# Patient Record
Sex: Female | Born: 1970 | Race: White | Hispanic: No | Marital: Married | Smoking: Former smoker
Health system: Southern US, Community
[De-identification: ages and names within clinical notes are randomized; demographics above are authoritative.]

## PROBLEM LIST (undated history)

## (undated) DIAGNOSIS — C801 Malignant (primary) neoplasm, unspecified: Secondary | ICD-10-CM

## (undated) DIAGNOSIS — T4145XA Adverse effect of unspecified anesthetic, initial encounter: Secondary | ICD-10-CM

## (undated) DIAGNOSIS — T7840XA Allergy, unspecified, initial encounter: Secondary | ICD-10-CM

## (undated) DIAGNOSIS — T8859XA Other complications of anesthesia, initial encounter: Secondary | ICD-10-CM

## (undated) HISTORY — PX: TUBAL LIGATION: SHX77

## (undated) HISTORY — PX: LEEP: SHX91

## (undated) HISTORY — DX: Allergy, unspecified, initial encounter: T78.40XA

## (undated) HISTORY — PX: OTHER SURGICAL HISTORY: SHX169

---

## 1998-12-09 DIAGNOSIS — D069 Carcinoma in situ of cervix, unspecified: Secondary | ICD-10-CM | POA: Insufficient documentation

## 1999-03-21 ENCOUNTER — Ambulatory Visit (HOSPITAL_BASED_OUTPATIENT_CLINIC_OR_DEPARTMENT_OTHER): Admission: RE | Admit: 1999-03-21 | Discharge: 1999-03-21 | Payer: Self-pay | Admitting: Orthopedic Surgery

## 2004-02-03 ENCOUNTER — Other Ambulatory Visit: Admission: RE | Admit: 2004-02-03 | Discharge: 2004-02-03 | Payer: Self-pay | Admitting: Family Medicine

## 2005-09-13 ENCOUNTER — Emergency Department: Payer: Self-pay | Admitting: Emergency Medicine

## 2006-03-26 ENCOUNTER — Encounter: Payer: Self-pay | Admitting: Family Medicine

## 2006-04-12 ENCOUNTER — Encounter: Admission: RE | Admit: 2006-04-12 | Discharge: 2006-04-12 | Payer: Self-pay | Admitting: Neurology

## 2006-05-07 ENCOUNTER — Encounter: Admission: RE | Admit: 2006-05-07 | Discharge: 2006-05-07 | Payer: Self-pay | Admitting: Neurology

## 2006-06-26 ENCOUNTER — Encounter: Payer: Self-pay | Admitting: Family Medicine

## 2006-07-06 ENCOUNTER — Emergency Department (HOSPITAL_COMMUNITY): Admission: EM | Admit: 2006-07-06 | Discharge: 2006-07-06 | Payer: Self-pay | Admitting: Family Medicine

## 2006-08-10 ENCOUNTER — Encounter: Payer: Self-pay | Admitting: Family Medicine

## 2006-08-10 ENCOUNTER — Encounter: Admission: RE | Admit: 2006-08-10 | Discharge: 2006-08-10 | Payer: Self-pay | Admitting: Orthopedic Surgery

## 2006-09-13 ENCOUNTER — Encounter: Admission: RE | Admit: 2006-09-13 | Discharge: 2006-09-13 | Payer: Self-pay | Admitting: Orthopedic Surgery

## 2006-09-13 ENCOUNTER — Encounter: Payer: Self-pay | Admitting: Family Medicine

## 2006-12-17 ENCOUNTER — Other Ambulatory Visit: Admission: RE | Admit: 2006-12-17 | Discharge: 2006-12-17 | Payer: Self-pay | Admitting: Obstetrics and Gynecology

## 2007-03-10 ENCOUNTER — Emergency Department (HOSPITAL_COMMUNITY): Admission: EM | Admit: 2007-03-10 | Discharge: 2007-03-10 | Payer: Self-pay | Admitting: Family Medicine

## 2008-01-08 LAB — HM MAMMOGRAPHY

## 2008-01-14 ENCOUNTER — Ambulatory Visit: Payer: Self-pay | Admitting: Certified Nurse Midwife

## 2008-12-08 LAB — CONVERTED CEMR LAB: Pap Smear: NORMAL

## 2008-12-08 LAB — HM PAP SMEAR

## 2009-01-06 ENCOUNTER — Ambulatory Visit: Payer: Self-pay | Admitting: Obstetrics and Gynecology

## 2009-01-13 ENCOUNTER — Ambulatory Visit: Payer: Self-pay | Admitting: Obstetrics and Gynecology

## 2009-01-17 ENCOUNTER — Emergency Department: Payer: Self-pay | Admitting: Emergency Medicine

## 2009-02-02 ENCOUNTER — Encounter: Payer: Self-pay | Admitting: Family Medicine

## 2009-02-10 ENCOUNTER — Ambulatory Visit: Payer: Self-pay | Admitting: Family Medicine

## 2009-02-10 DIAGNOSIS — M2550 Pain in unspecified joint: Secondary | ICD-10-CM

## 2009-02-10 DIAGNOSIS — J309 Allergic rhinitis, unspecified: Secondary | ICD-10-CM | POA: Insufficient documentation

## 2009-02-10 DIAGNOSIS — N809 Endometriosis, unspecified: Secondary | ICD-10-CM | POA: Insufficient documentation

## 2009-02-20 ENCOUNTER — Ambulatory Visit: Payer: Self-pay | Admitting: Family Medicine

## 2009-02-20 DIAGNOSIS — L089 Local infection of the skin and subcutaneous tissue, unspecified: Secondary | ICD-10-CM | POA: Insufficient documentation

## 2009-02-21 ENCOUNTER — Encounter: Payer: Self-pay | Admitting: Family Medicine

## 2009-03-08 ENCOUNTER — Telehealth: Payer: Self-pay | Admitting: Family Medicine

## 2009-03-08 ENCOUNTER — Ambulatory Visit: Payer: Self-pay | Admitting: Family Medicine

## 2009-03-08 LAB — CONVERTED CEMR LAB: Blood Glucose, Fasting: 88 mg/dL

## 2009-03-10 LAB — CONVERTED CEMR LAB
Albumin: 4.4 g/dL (ref 3.5–5.2)
Alkaline Phosphatase: 55 units/L (ref 39–117)
BUN: 13 mg/dL (ref 6–23)
Bilirubin, Direct: 0.1 mg/dL (ref 0.0–0.3)
CO2: 27 meq/L (ref 19–32)
Calcium: 9.3 mg/dL (ref 8.4–10.5)
Cholesterol: 176 mg/dL (ref 0–200)
Creatinine, Ser: 0.7 mg/dL (ref 0.4–1.2)
LDL Cholesterol: 100 mg/dL — ABNORMAL HIGH (ref 0–99)
Total CHOL/HDL Ratio: 3
Total Protein: 7.1 g/dL (ref 6.0–8.3)
Triglycerides: 50 mg/dL (ref 0.0–149.0)

## 2009-03-15 ENCOUNTER — Ambulatory Visit: Payer: Self-pay | Admitting: Family Medicine

## 2009-03-17 ENCOUNTER — Telehealth: Payer: Self-pay | Admitting: Family Medicine

## 2009-06-01 ENCOUNTER — Ambulatory Visit: Payer: Self-pay | Admitting: Family Medicine

## 2009-06-14 ENCOUNTER — Telehealth: Payer: Self-pay | Admitting: Family Medicine

## 2009-06-15 ENCOUNTER — Telehealth: Payer: Self-pay | Admitting: Family Medicine

## 2009-07-19 ENCOUNTER — Telehealth: Payer: Self-pay | Admitting: Family Medicine

## 2009-11-17 ENCOUNTER — Telehealth: Payer: Self-pay | Admitting: Family Medicine

## 2010-01-09 ENCOUNTER — Ambulatory Visit: Payer: Self-pay | Admitting: Family Medicine

## 2010-01-09 DIAGNOSIS — F341 Dysthymic disorder: Secondary | ICD-10-CM | POA: Insufficient documentation

## 2010-02-06 ENCOUNTER — Telehealth: Payer: Self-pay | Admitting: Family Medicine

## 2010-03-07 ENCOUNTER — Ambulatory Visit: Payer: Self-pay | Admitting: Family Medicine

## 2010-03-13 DIAGNOSIS — R740 Nonspecific elevation of levels of transaminase and lactic acid dehydrogenase [LDH]: Secondary | ICD-10-CM

## 2010-03-13 LAB — CONVERTED CEMR LAB
ALT: 57 units/L — ABNORMAL HIGH (ref 0–35)
AST: 29 units/L (ref 0–37)
Bilirubin, Direct: 0.1 mg/dL (ref 0.0–0.3)
Chloride: 106 meq/L (ref 96–112)
Cholesterol: 191 mg/dL (ref 0–200)
GFR calc non Af Amer: 105.84 mL/min (ref >60)
Glucose, Bld: 84 mg/dL (ref 70–99)
HDL: 78.3 mg/dL (ref >39.00)
LDL Cholesterol: 103 mg/dL — ABNORMAL HIGH (ref 0–99)
Total Bilirubin: 0.6 mg/dL (ref 0.3–1.2)
Total CHOL/HDL Ratio: 2
Total Protein: 7.5 g/dL (ref 6.0–8.3)
Triglycerides: 49 mg/dL (ref 0.0–149.0)
VLDL: 9.8 mg/dL (ref 0.0–40.0)

## 2010-03-28 ENCOUNTER — Ambulatory Visit: Payer: Self-pay | Admitting: Family Medicine

## 2010-03-28 ENCOUNTER — Telehealth: Payer: Self-pay | Admitting: Family Medicine

## 2010-03-28 LAB — CONVERTED CEMR LAB: Albumin: 4.3 g/dL (ref 3.5–5.2)

## 2010-03-29 ENCOUNTER — Telehealth: Payer: Self-pay | Admitting: Family Medicine

## 2010-03-30 ENCOUNTER — Telehealth: Payer: Self-pay | Admitting: Family Medicine

## 2010-03-30 ENCOUNTER — Ambulatory Visit: Payer: Self-pay | Admitting: Family Medicine

## 2010-03-30 ENCOUNTER — Encounter: Payer: Self-pay | Admitting: Family Medicine

## 2010-03-30 LAB — CONVERTED CEMR LAB
HCV Ab: NEGATIVE
Hep A IgM: NEGATIVE
Hep B C IgM: NEGATIVE
Hepatitis B Surface Ag: NEGATIVE

## 2010-04-04 ENCOUNTER — Ambulatory Visit: Payer: Self-pay | Admitting: Family Medicine

## 2010-04-06 LAB — CONVERTED CEMR LAB
ALT: 94 units/L — ABNORMAL HIGH (ref 0–35)
AST: 39 units/L — ABNORMAL HIGH (ref 0–37)
Alkaline Phosphatase: 117 units/L (ref 39–117)
Bilirubin, Direct: 0.2 mg/dL (ref 0.0–0.3)
Total Bilirubin: 1.2 mg/dL (ref 0.3–1.2)
Total Protein: 7.1 g/dL (ref 6.0–8.3)

## 2010-04-19 ENCOUNTER — Ambulatory Visit: Payer: Self-pay | Admitting: Family Medicine

## 2010-04-23 LAB — CONVERTED CEMR LAB
ALT: 73 units/L — ABNORMAL HIGH (ref 0–35)
Albumin: 4.4 g/dL (ref 3.5–5.2)
Bilirubin, Direct: 0.1 mg/dL (ref 0.0–0.3)
Total Protein: 6.9 g/dL (ref 6.0–8.3)

## 2010-05-30 ENCOUNTER — Ambulatory Visit: Payer: Self-pay | Admitting: Family Medicine

## 2010-05-31 LAB — CONVERTED CEMR LAB
AST: 23 units/L (ref 0–37)
Albumin: 4.3 g/dL (ref 3.5–5.2)
Alkaline Phosphatase: 68 units/L (ref 39–117)

## 2010-07-02 ENCOUNTER — Ambulatory Visit: Payer: Self-pay | Admitting: Family Medicine

## 2010-07-02 DIAGNOSIS — N898 Other specified noninflammatory disorders of vagina: Secondary | ICD-10-CM | POA: Insufficient documentation

## 2010-07-02 LAB — CONVERTED CEMR LAB: Whiff Test: POSITIVE

## 2010-08-24 ENCOUNTER — Encounter: Payer: Self-pay | Admitting: Family Medicine

## 2010-09-30 ENCOUNTER — Encounter: Payer: Self-pay | Admitting: Obstetrics and Gynecology

## 2010-10-09 NOTE — Assessment & Plan Note (Signed)
Summary: ? VAGINAL INFECTION   Vital Signs:  Patient profile:   40 year old female Height:      61 inches Weight:      125 pounds BMI:     23.70 Temp:     98.4 degrees F oral Pulse rate:   76 / minute Pulse rhythm:   regular BP sitting:   110 / 76  (right arm) Cuff size:   regular  Vitals Entered By: Linde Gillis CMA Duncan Dull) (July 02, 2010 12:18 PM) CC: ? vaginal infection   History of Present Illness: 40 yo with ? vaginal infection  Last week had some vaginal itching and burning, no discharge. Used topical monistat, helped a little but symptoms returned. yesterday, had some thick discharge, no odor. Feels like her lower abdomen is tight. No dysuria, fever or chills.    Current Medications (verified): 1)  Probiotics .Marland KitchenMarland Kitchen. 1 Daily By Mouth 2)  Vitamin Code For Women .... 2 Capsules Two Times A Day By Mouth 3)  Metronidazole 0.75 % Gel (Metronidazole) .... 5 Gm Daily X 5 Days. Dispense Qs 4)  Diflucan 150 Mg Tabs (Fluconazole) .... Once Daily  Allergies: 1)  ! * Aspirin 2)  ! * Erythomycin 3)  ! * Augmentin 4)  ! * Versaid 5)  ! * Stadol 6)  ! * Codeine 7)  ! * Latex 8)  ! * Thinerasol  Review of Systems      See HPI General:  Denies fever. GI:  Denies nausea and vomiting. GU:  Complains of discharge; denies dysuria.  Physical Exam  General:  Well-developed,well-nourished,in no acute distress; alert,appropriate and cooperative throughout examination  Genitalia:  thick vaginal discharge, no cervical motion tenderness. normal introitus, no vaginal or cervical lesions, no friaility or hemorrhage, and no adnexal masses or tenderness.   Psych:    Very dramatic   Impression & Recommendations:  Problem # 1:  VAGINAL DISCHARGE (ICD-623.5) Assessment New Wet prep consistent with yeast and BV.  Pt refuses oral flagyl, makes her nauseated. Treat with by mouth diflucan x 1 and topical flagyl.  Send for GC/chlamydia (no CMT or signs of PID). Her updated medication  list for this problem includes:    Metronidazole 0.75 % Gel (Metronidazole) .Marland KitchenMarland KitchenMarland KitchenMarland Kitchen 5 gm daily x 5 days. dispense qs  Orders: Wet Prep (27253GU) T-GC Probe, genital ([id number]) T-Chlamydia Probe, genital ([id number])  Complete Medication List: 1)  Probiotics  .Marland Kitchen.. 1 daily by mouth 2)  Vitamin Code For Women  .... 2 capsules two times a day by mouth 3)  Metronidazole 0.75 % Gel (Metronidazole) .... 5 gm daily x 5 days. dispense qs 4)  Diflucan 150 Mg Tabs (Fluconazole) .... Once daily Prescriptions: DIFLUCAN 150 MG TABS (FLUCONAZOLE) once daily  #1 x 0   Entered and Authorized by:   Ruthe Mannan MD   Signed by:   Ruthe Mannan MD on 07/02/2010   Method used:   Electronically to        [facility] (retail)       [address]       [place], [place]  [address]       Ph: [phone]       Fax: [phone]   RxID:   [id number] METRONIDAZOLE 0.75 % GEL (METRONIDAZOLE) 5 gm daily x 5 days. dispense qs  #1 x 0   Entered and Authorized by:   Ruthe Mannan MD   Signed by:   Jovita Gamma  Dayton Martes MD on 07/02/2010   Method used:   Electronically to        [facility] (retail)       [address]       [place], [place]  [id number]       Ph: [phone]       Fax: [phone]   RxID:   [id number]    Orders Added: 1)  Wet Prep [[id number]] 2)  T-GC Probe, genital [phone] 3)  T-Chlamydia Probe, genital [[id number]] 4)  Est. Patient Level IV [95188]    Current Allergies (reviewed today): ! * ASPIRIN ! * ERYTHOMYCIN ! * AUGMENTIN ! * VERSAID ! * STADOL ! * CODEINE ! * LATEX ! [facility] Laboratory Results    Wet Mount/KOH Source: vaginal WBC/hpf 1-5 Bacteria/hpf 1+ Clue cells/hpf few  Positive whiff Yeast/hpf few Trichomonas/hpf none

## 2010-10-09 NOTE — Progress Notes (Signed)
Summary: please review lab results  Phone Note Call from Patient Call back at Home Phone [phone]   Caller: Patient Summary of Call: Pt called for hep test results.  She was told she could go for an [place] today if tests were negative.  She is very anxious and wants something done.  She would prefer to go to  for ultrasound, if ultrasound is necessary. Initial call taken by: Lowella Petties CMA,  March 29, 2010 2:04 PM  Follow-up for Phone Call        not fully back yet.  Follow-up by: Hannah Beat MD,  March 29, 2010 2:11 PM  Additional Follow-up for Phone Call Additional follow up Details #1::        just let know not all back yet - likely not all back by tonight, and Dr. Ermalene Searing will look at them in the AM Additional Follow-up by: Hannah Beat MD,  March 29, 2010 2:27 PM    Additional Follow-up for Phone Call Additional follow up Details #2::    Per Dr. Dayton Martes, advised pt that 2 tests are negative, still waiting on 2.  She is asking if ok to have relations with her husband, per Dr Dayton Martes advised that she should use condoms until all tests are back.            Lowella Petties CMA  March 29, 2010 2:30 PM

## 2010-10-09 NOTE — Progress Notes (Signed)
Summary: called report on abd ultrasound  Phone Note From Other Clinic   Caller: [facility] Summary of Call: Called report on abd ultrasound- negative, liver is normal. Initial call taken by: Lowella Petties CMA,  March 30, 2010 11:33 AM  Follow-up for Phone Call        Notify pt that Korea is nml...no cirrhoisis seen, no gallstones, no gallbladder disease. Recommend staying of amitryptiline and rechec hepatic panel next week.  Follow-up by: Kerby Nora MD,  March 30, 2010 11:36 AM     Appended Document: called report on abd ultrasound LMOM advising of [place] results. Advised to stay off of the amitriptyline and repeat blood work next week.

## 2010-10-09 NOTE — Progress Notes (Signed)
Summary: Medication update  Phone Note Call from Patient Call back at [phone]   Caller: Patient Call For: Kerby Nora MD Summary of Call: Patient says she was asked to phone in with an update on her medication (Amitriptyline).  She says it is working some but she is still not sleeping as she should.  She is waking up in the middle of the night.  Feels that the dose may need to be increased.  [facility]. Initial call taken by: Delilah Shan CMA (AAMA),  Feb 06, 2010 11:00 AM    New/Updated Medications: AMITRIPTYLINE HCL 50 MG TABS (AMITRIPTYLINE HCL) Take 1 tablet by mouth once a day Prescriptions: AMITRIPTYLINE HCL 50 MG TABS (AMITRIPTYLINE HCL) Take 1 tablet by mouth once a day  #30 x 11   Entered and Authorized by:   Kerby Nora MD   Signed by:   Kerby Nora MD on 02/06/2010   Method used:   Electronically to        [facility] (retail)       [address]       [place], [place]  [address]       Ph: [phone]       Fax: [phone]   RxID:   [id number] AMITRIPTYLINE HCL 50 MG TABS (AMITRIPTYLINE HCL) Take 1 tablet by mouth once a day  #30 x 11   Entered and Authorized by:   Kerby Nora MD   Signed by:   Kerby Nora MD on 02/06/2010   Method used:   Electronically to        [facility] (retail)       [address]       [place], [place]  [address]       Ph: [phone]       Fax: [phone]   RxID:   [id number]

## 2010-10-09 NOTE — Progress Notes (Signed)
Summary: wants tamiflu  Phone Note Call from Patient Call back at Home Phone [phone]   Caller: Patient Call For: Kerby Nora MD Summary of Call: Patient says that she woke up this morning with a fever of 102, body aches, chills. Yesterday she called about her daughter having the flu and her daughter's entire gymnastic team having the flu. She wants to know if she can get tamiflu called in to [facility].  Initial call taken by: Melody Comas,  November 17, 2009 8:46 AM  Follow-up for Phone Call        done Follow-up by: Hannah Beat MD,  November 17, 2009 9:02 AM  Additional Follow-up for Phone Call Additional follow up Details #1::        Patient notified. Additional Follow-up by: Melody Comas,  November 17, 2009 9:08 AM    New/Updated Medications: TAMIFLU 75 MG CAPS (OSELTAMIVIR PHOSPHATE) Take one capsule by mouth twice a day Prescriptions: TAMIFLU 75 MG CAPS (OSELTAMIVIR PHOSPHATE) Take one capsule by mouth twice a day  #10 x 0   Entered and Authorized by:   Hannah Beat MD   Signed by:   Hannah Beat MD on 11/17/2009   Method used:   Electronically to        [facility] (retail)       [address]       [place], [place]  [id number]       Ph: [phone]       Fax: [phone]   RxID:   [id number]

## 2010-10-09 NOTE — Progress Notes (Signed)
Summary: wants to talk to you  Phone Note Call from Patient Call back at [phone]   Caller: Patient Call For: Kerby Nora MD Summary of Call: Patient calling very upset and worried about test results could you please call her as soon as possible to reassure her about labs.Consuello Masse CMA   Initial call taken by: Benny Lennert CMA Duncan Dull),  March 28, 2010 4:58 PM  Follow-up for Phone Call        Called pt to discuss test results..no answer.Left detailed message explainging results. Will await hep panel..if abd painN/V fever go to ER. Stop amitryptiline.   If hep panel neg...will proceed with RUQ abd [place] tommorow.  Follow-up by: Kerby Nora MD,  March 28, 2010 5:09 PM

## 2010-10-09 NOTE — Assessment & Plan Note (Signed)
Summary: CPX with pap   Vital Signs:  Patient profile:   40 year old female Height:      61 inches Weight:      118.8 pounds BMI:     22.53 Temp:     98.6 degrees F oral Pulse rate:   76 / minute Pulse rhythm:   regular BP sitting:   98 / 60  (left arm) Cuff size:   regular  Vitals Entered By: Benny Lennert CMA Duncan Dull) (Jan 09, 2010 1:56 PM)  History of Present Illness: Chief complaint cpx with pap  The patient is here for annual wellness exam and preventative care.    Joint pain, multiple joints: [facility]never [facility] due to [facility] constraints. Celebrex not covered by insurance.  HAs continued pain in joints, but dealing with.  Exercsiing regularly, Yoga.     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 1 Daily By Mouth 2)  Vitamin Code For Women .... 2 Capsules Two Times A Day By Mouth  Allergies: 1)  ! * Aspirin 2)  ! * Erythomycin 3)  ! * Augmentin 4)  ! * Versaid 5)  ! * Stadol 6)  ! * Codeine 7)  ! * Latex 8)  ! * Thinerasol  Past History:  Past medical, surgical, family and social histories (including risk factors) reviewed, and no changes noted (except as noted below).  Past Medical History: Reviewed history from 02/10/2009 and no changes required. Allergic rhinitis  Past Surgical History: Reviewed history from 02/10/2009 and no changes required. LEEP  1990s groin cyst removal 09/2001   misscarriage  2003 emergancy C-section due to twins premature 2010  IUD removal BTL, right ovary  Family History: Reviewed history from 02/10/2009 and no changes required. father: high cholesterol,  HIV mother: HTN, bipolar do sister: IBS MGM: CAD, MI age 48, breast cancer MGF: HTN, pancreatic cancer, ? why on coumadin PGM: DM  Social History: Reviewed history from 02/10/2009 and no changes required. 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 CV:  Denies chest pain or discomfort. Resp:  Denies shortness of breath, sputum productive, and wheezing. GI:  Denies bloody stools, constipation, and diarrhea; some abdominal cramping...dx with endometriosis but refuses OCPs. GU:  Denies abnormal vaginal bleeding and dysuria. Derm:  Denies rash. Psych:  Complains of depression; denies anxiety, easily angered, mental problems, panic attacks, sense of great danger, and suicidal thoughts/plans; Increase oin stress with financial issues. Poor sleep at night..so due to joint pain. Zoloft , Paxil, wellbutrin minimal help.Marland Kitchenopen to trying medicaiton.  Denies manic symptoms.  Worried about weight gain and sexual function SE. Marland Kitchen  Physical Exam  General:  Well-developed,well-nourished,in no acute distress; alert,appropriate and cooperative throughout examination  Ears:  External ear exam shows no significant lesions or deformities.  Otoscopic examination reveals clear canals, tympanic membranes are intact bilaterally without bulging, retraction, inflammation or discharge. Hearing is grossly normal bilaterally. Nose:  External nasal examination shows no deformity or inflammation. Nasal mucosa  are pink and moist without lesions or exudates. Mouth:  MMM Neck:  no carotid bruit or thyromegaly no cervical or supraclavicular lymphadenopathy  Chest Wall:  No deformities, masses, or tenderness noted. Breasts:  No mass, nodules, thickening, tenderness, bulging, retraction, inflamation, nipple discharge or skin  changes noted.   Lungs:  Normal respiratory effort, chest expands symmetrically. Lungs are clear to auscultation, no crackles or wheezes. Heart:  Normal rate and regular rhythm. S1 and S2 normal without gallop, murmur, click, rub or other extra sounds. Abdomen:  Bowel sounds positive,abdomen soft and non-tender without masses, organomegaly or hernias noted. Msk:  TTP in Right wrist, right elbow, left shoulder, hips, taibone, right knee and left ankle slightly decreased ROM thoughout these joints due to pain...no erytthema, swelling, synovitis sen in any joints Pulses:  R and L posterior tibial pulses are full and equal bilaterally  Extremities:  No clubbing, cyanosis, edema, or deformity  Skin:  Intact without suspicious lesions or rashes Psych:  Oriented X3, memory intact for recent and remote, good eye contact, not anxious appearing, and not depressed appearing.   Very dramatic..? histrionic personbality disorder.    Impression & Recommendations:  Problem # 1:  Preventive Health Care (ICD-V70.0) The patient's preventative maintenance and recommended screening tests for an annual wellness exam were reviewed in full today. Brought up to date unless services declined.  Counselled on the importance of diet, exercise, and its role in overall health and mortality. The patient's FH and SH was reviewed, including their home life, tobacco status, and drug and alcohol status.     Problem # 2:  Gynecological examination-routine (ICD-V72.31) PAP q 2 years.Marland KitchenDVE done today.   Problem # 3:  DEPRESSION/ANXIETY (ICD-300.4) As well as chronic body pain...neg Rheum work up. Cannot afford Celebrex or referral to [facility].  Will start amitryptiline low dose at betime to hopefully help with mood, insomnia as well as body pain. Discussed SE.   Complete Medication List: 1)  Probiotics  .Marland Kitchen.. 1 daily by mouth 2)  Vitamin Code For Women  .... 2 capsules two times a day by mouth  Patient  Instructions: 1)  Amitryptiline 25 mg daily at bedtime.  2)  Call for follow up in 3-4 weeks.  3)  In June come back for Fasting lipids, CMET Dx v77.91  Current Allergies (reviewed today): ! * ASPIRIN ! * ERYTHOMYCIN ! * AUGMENTIN ! * VERSAID ! * STADOL ! * CODEINE ! * LATEX ! * THINERASOL  Flu Vaccine Next Due:  Refused TD Next Due:  Refused Last PAP:  normal (12/08/2008 10:33:04 AM) PAP Next Due:  2 yr Has had yearly pap smears  yearly. Last abnormal 2000..nml since  LEEP    Past Medical History:    Reviewed history from 02/10/2009 and no changes required:       Allergic rhinitis  Past Surgical History:    Reviewed history from 02/10/2009 and no changes required:       LEEP        1990s groin cyst removal       09/2001   misscarriage        2003 emergancy C-section due to twins premature       2010  IUD removal BTL, right ovary  Appended Document: CPX with pap DVE.Marland Kitchenno massses, vaginal introitis pink  Appended Document: Orders Update    Clinical Lists Changes  Medications: Added new medication of AMITRIPTYLINE HCL 25 MG TABS (AMITRIPTYLINE HCL) 1 tab by mouth at bedtime -  Signed Rx of AMITRIPTYLINE HCL 25 MG TABS (AMITRIPTYLINE HCL) 1 tab by mouth at bedtime;  #30 x 11;  Signed;  Entered by: Kerby Nora MD;  Authorized by: Kerby Nora MD;  Method used: Electronically to [facility] [address], [place], [place]  [address], Ph: [phone], Fax: [phone]    Prescriptions: AMITRIPTYLINE HCL 25 MG TABS (AMITRIPTYLINE HCL) 1 tab by mouth at bedtime  #30 x 11   Entered and Authorized by:   Kerby Nora MD   Signed by:   Kerby Nora MD on 01/09/2010   Method used:   Electronically to        [facility] (retail)       [address]       [place], [place]  [address]       Ph: [phone]       Fax: [phone]   RxID:   [id number]

## 2010-10-11 NOTE — Letter (Signed)
Summary: [facility]  [facility]   Imported By: Lester Lyncourt 09/11/2010 10:49:03  _____________________________________________________________________  External Attachment:    Type:   Image     Comment:   External Document

## 2011-01-24 ENCOUNTER — Telehealth (INDEPENDENT_AMBULATORY_CARE_PROVIDER_SITE_OTHER): Payer: Managed Care, Other (non HMO) | Admitting: Family Medicine

## 2011-01-24 DIAGNOSIS — Z13220 Encounter for screening for lipoid disorders: Secondary | ICD-10-CM

## 2011-01-24 NOTE — Telephone Encounter (Signed)
Message copied by Kerby Nora on Thu Jan 24, 2011 10:58 PM ------      Message from: Liane Comber      Created: Thu Jan 24, 2011 11:50 AM      Regarding: Cpx labs wed       Please order  future cpx labs for pt's upcomming lab appt.      Thanks      Rodney Booze

## 2011-01-30 ENCOUNTER — Other Ambulatory Visit (INDEPENDENT_AMBULATORY_CARE_PROVIDER_SITE_OTHER): Payer: Managed Care, Other (non HMO) | Admitting: Family Medicine

## 2011-01-30 DIAGNOSIS — Z13220 Encounter for screening for lipoid disorders: Secondary | ICD-10-CM

## 2011-01-30 LAB — COMPREHENSIVE METABOLIC PANEL
ALT: 39 U/L — ABNORMAL HIGH (ref 0–35)
BUN: 14 mg/dL (ref 6–23)
CO2: 30 mEq/L (ref 19–32)
Calcium: 9.3 mg/dL (ref 8.4–10.5)
Chloride: 104 mEq/L (ref 96–112)
Creatinine, Ser: 0.6 mg/dL (ref 0.4–1.2)
GFR: 115.38 mL/min (ref >60.00)
Potassium: 4 mEq/L (ref 3.5–5.1)
Total Bilirubin: 0.8 mg/dL (ref 0.3–1.2)
Total Protein: 6.9 g/dL (ref 6.0–8.3)

## 2011-01-30 LAB — LIPID PANEL: Cholesterol: 179 mg/dL (ref 0–200)

## 2011-01-31 ENCOUNTER — Encounter: Payer: Self-pay | Admitting: Family Medicine

## 2011-02-05 ENCOUNTER — Encounter: Payer: Self-pay | Admitting: Family Medicine

## 2011-02-05 ENCOUNTER — Other Ambulatory Visit (HOSPITAL_COMMUNITY)
Admission: RE | Admit: 2011-02-05 | Discharge: 2011-02-05 | Disposition: A | Discharge status: Home / Self Care | Payer: Managed Care, Other (non HMO) | Source: Ambulatory Visit | Attending: Family Medicine | Admitting: Family Medicine

## 2011-02-05 ENCOUNTER — Ambulatory Visit (INDEPENDENT_AMBULATORY_CARE_PROVIDER_SITE_OTHER): Payer: Managed Care, Other (non HMO) | Admitting: Family Medicine

## 2011-02-05 DIAGNOSIS — H538 Other visual disturbances: Secondary | ICD-10-CM

## 2011-02-05 DIAGNOSIS — Z01419 Encounter for gynecological examination (general) (routine) without abnormal findings: Secondary | ICD-10-CM

## 2011-02-05 DIAGNOSIS — G43829 Menstrual migraine, not intractable, without status migrainosus: Secondary | ICD-10-CM

## 2011-02-05 DIAGNOSIS — Z1231 Encounter for screening mammogram for malignant neoplasm of breast: Secondary | ICD-10-CM

## 2011-02-05 DIAGNOSIS — Z0000 Encounter for general adult medical examination without abnormal findings: Secondary | ICD-10-CM

## 2011-02-05 DIAGNOSIS — H5710 Ocular pain, unspecified eye: Secondary | ICD-10-CM

## 2011-02-05 DIAGNOSIS — T148XXA Other injury of unspecified body region, initial encounter: Secondary | ICD-10-CM

## 2011-02-05 LAB — CBC WITH DIFFERENTIAL/PLATELET
Basophils Absolute: 0 10*3/uL (ref 0.0–0.1)
Eosinophils Absolute: 0.1 10*3/uL (ref 0.0–0.7)
Hemoglobin: 13.7 g/dL (ref 12.0–15.0)
Lymphs Abs: 1.9 10*3/uL (ref 0.7–4.0)
MCHC: 34.7 g/dL (ref 30.0–36.0)
MCV: 94.6 fl (ref 78.0–100.0)
Monocytes Absolute: 0.4 10*3/uL (ref 0.1–1.0)
Neutro Abs: 2.4 10*3/uL (ref 1.4–7.7)
Platelets: 213 10*3/uL (ref 150.0–400.0)
RDW: 12.7 % (ref 11.5–14.6)

## 2011-02-05 LAB — TSH: TSH: 1 u[IU]/mL (ref 0.35–5.50)

## 2011-02-05 MED ORDER — NORETHINDRONE ACET-ETHINYL EST 1.5-30 MG-MCG PO TABS: 1.0000 | ORAL_TABLET | Freq: Every day | ORAL | Status: DC

## 2011-02-05 MED ORDER — SUMATRIPTAN SUCCINATE 100 MG PO TABS: ORAL_TABLET | ORAL | Status: DC

## 2011-02-05 NOTE — Progress Notes (Signed)
Subjective:    Patient ID: Caitlin Gallagher, female    DOB: February 08, 1971, 40 y.o.   MRN: [id number]  HPI  The patient is here for annual wellness exam and preventative care.    She has the following acute and chronic issues:  1.  4-5 day migraine around time of ovulation. Ongoing x  12 months. Has history of migraines when she was younger.  Wakes her up in middle of the night, pain on top of head, no nausea, very sensitive  to light and sound.  Has headache currently.  Has started having a light menses cycle lasting 3-4 days some time 11-12 days  s/p ablation. Very irregular.  Interested in  OCP to treat symptoms , understands risks and benefits.  In past imitrex has helped significantly.  Has tried 800 mg motrin and tylenol .Marland Kitchen Helps minimally.  2. Eye pain in AMs occuring daily. For past 2-3 months.  Has seen eye MD [facility]), nml eye exam.  Sharp pain in corner of left eye and following blurriness in eyes lasts 1-12 hours.  Given pataday for allergies... No help. Also concerns about MS.Marland Kitchen occ feels like muscles buzzing. No numbness, but feels like she is weaker with push ups. Has seen someone at [facility] in past.  3. Slowed healing. Bruises lasting a long time. Not bruising more easily. Has more cavities than she has ever before.  Grandmother with leukemia. She is concerned about why she is "falling apart at age 42"  Eating fruit and veggies. Getting 30 min of strengthening daily, no CV currently.   Review of Systems     Objective:   Physical Exam  Constitutional: Vital signs are normal. She appears well-developed and well-nourished. She is cooperative.  Non-toxic appearance. She does not appear ill. No distress.  HENT:  Head: Normocephalic.  Right Ear: Hearing, tympanic membrane, external ear and ear canal normal.  Left Ear: Hearing, tympanic membrane, external ear and ear canal normal.  Nose: Nose normal.  Eyes: Conjunctivae, EOM and lids are normal.  Pupils are equal, round, and reactive to light. No foreign bodies found.  Neck: Trachea normal and normal range of motion. Neck supple. Carotid bruit is not present. No mass and no thyromegaly present.  Cardiovascular: Normal rate, regular rhythm, S1 normal, S2 normal, normal heart sounds and intact distal pulses.  Exam reveals no gallop.   No murmur heard. Pulmonary/Chest: Effort normal and breath sounds normal. No respiratory distress. She has no wheezes. She has no rhonchi. She has no rales.  Abdominal: Soft. Normal appearance and bowel sounds are normal. She exhibits no distension, no fluid wave, no abdominal bruit and no mass. There is no hepatosplenomegaly. There is no tenderness. There is no rebound, no guarding and no CVA tenderness. No hernia.  Genitourinary: Vagina normal and uterus normal. No breast swelling, tenderness, discharge or bleeding. Pelvic exam was performed with patient prone. There is no rash, tenderness or lesion on the right labia. There is no rash, tenderness or lesion on the left labia. Uterus is not enlarged and not tender. Cervix exhibits no motion tenderness, no discharge and no friability. Right adnexum displays no mass, no tenderness and no fullness. Left adnexum displays no mass, no tenderness and no fullness.  Lymphadenopathy:    She has no cervical adenopathy.    She has no axillary adenopathy.  Neurological: She is alert. She has normal strength. No cranial nerve deficit or sensory deficit.  Skin: Skin is warm, dry and intact. No rash  noted.  Psychiatric: Her speech is normal and behavior is normal. Judgment normal. Her mood appears not anxious. Cognition and memory are normal. She does not exhibit a depressed mood.          Assessment & Plan:  Complete Physical Exam: The patient's preventative maintenance and recommended screening tests for an annual wellness exam were reviewed in full today. Brought up to date unless services declined.  Counselled on the  importance of diet, exercise, and its role in overall health and mortality. The patient's FH and SH was reviewed, including their home life, tobacco status, and drug and alcohol status.

## 2011-02-05 NOTE — Patient Instructions (Addendum)
Stop to speak with Shirlee Limerick on you way out to set up referral.  Start Loestrin for menstrual migraine. Keep diary to determine possible other triggers. Use sumatriptan for headache treatment.

## 2011-02-07 ENCOUNTER — Encounter: Payer: Self-pay | Admitting: *Deleted

## 2011-02-11 DIAGNOSIS — G43829 Menstrual migraine, not intractable, without status migrainosus: Secondary | ICD-10-CM | POA: Insufficient documentation

## 2011-02-11 NOTE — Assessment & Plan Note (Signed)
Intermittant blurred vision with normal eye MD exam.  Possibly due to alleries, but medications have not helped.Concern for MS. No other neuro symptoms.  Will refer to neuro for further eval.

## 2011-02-11 NOTE — Assessment & Plan Note (Signed)
Unclear cause. See note on blurred vision.

## 2011-02-11 NOTE — Assessment & Plan Note (Signed)
Resolved

## 2011-02-11 NOTE — Assessment & Plan Note (Signed)
Discussed options for treatment. Given menstrual irregularitiy.. She is interested in OCPs as prophylactic. We discussed risk of DVT associated with hormone use at an older age. Pt understands and chooses to use this medication as treatment. We will use a lower estrogen option.  Triptan given for acute treatment. Info given on triggers, encouraged her to keep migrane diary.

## 2011-02-18 ENCOUNTER — Telehealth: Payer: Self-pay | Admitting: *Deleted

## 2011-02-18 NOTE — Telephone Encounter (Signed)
I would recommend giving the medicaiton several cycles before we adjust it, may take more time to regulate hormones.

## 2011-02-18 NOTE — Telephone Encounter (Signed)
Patient calling to give you an update on how her birth control is working. She says that she feels like it may need to be tweaked a little bit because she says that she is now ovulating and she still got a really bad headache, but she wouldn't call it a migraine so it was an improvement. She said that also she had really bad cramping and fell nauseated just like she did years ago when she had an ovarian cyst. Please advise

## 2011-02-19 MED ORDER — NORETHINDRONE ACET-ETHINYL EST 1.5-30 MG-MCG PO TABS: 1.0000 | ORAL_TABLET | Freq: Every day | ORAL | Status: DC

## 2011-02-19 NOTE — Telephone Encounter (Signed)
Patient advised and refill sent to pharmacy.  

## 2011-02-28 ENCOUNTER — Telehealth: Payer: Self-pay | Admitting: Family Medicine

## 2011-02-28 NOTE — Telephone Encounter (Signed)
Patient called to say that she owes [facility] a significant amount of money from seeing them previously. She says that she cant pay them and they will not schedule her an appt  until she does. Please cancel the referral to them per the patient.

## 2011-02-28 NOTE — Telephone Encounter (Signed)
Okay, noted.  I do not think it is an emergent/urgent referral.

## 2011-03-11 ENCOUNTER — Telehealth: Payer: Self-pay | Admitting: *Deleted

## 2011-03-11 NOTE — Telephone Encounter (Signed)
Patient advised and she wants to think about it and will call us back with here discision.

## 2011-03-11 NOTE — Telephone Encounter (Signed)
The next option would be or migraine prophylaxsis in general... A med called topiramate. Also used as an antiepileptic but found to reduce frequency of migraines.  SE include weight LOSS, sleepyness (so take it at night) as main SE.  Let me know if interested or have her make an appt to discuss other options.

## 2011-03-11 NOTE — Telephone Encounter (Signed)
Patient says that she would like to try the topiramate and is asking if it could be called in to [facility].

## 2011-03-11 NOTE — Telephone Encounter (Signed)
Patient called to let you know that she will not be able to take the birth control, says that the sided affects are not worth it. She has no sexual desire, depression is starting to set in, has 4 or more bowel movements a day and she is still having the headaches. She say that she isn't sure that she would even want to try another birth control because she feel that they will all be the same. Asking if there are any other suggestions for the headaches. Uses [facility]. Please advise.

## 2011-03-14 ENCOUNTER — Telehealth: Payer: Self-pay | Admitting: *Deleted

## 2011-03-14 MED ORDER — TOPIRAMATE 25 MG PO TABS: 25.0000 mg | ORAL_TABLET | Freq: Two times a day (BID) | ORAL | Status: DC

## 2011-03-14 NOTE — Telephone Encounter (Signed)
Call patient, have her start 1 tablet a day for 1 week, then 1 po bid after the first week  This medication takes a long time to titrate up to the effective dose usually  Will cc: Dr. B to see when she wants her to f/u

## 2011-03-14 NOTE — Telephone Encounter (Signed)
Patient was supposed to be started on topamax and this medication was never called in by Dr. Ermalene Searing and patient really would like medication called in today to [facility]. Can you please do this? Look at past phone notes.Marland Kitchen

## 2011-03-14 NOTE — Telephone Encounter (Signed)
Patient advised as instructed 

## 2011-03-14 NOTE — Telephone Encounter (Signed)
Left message on machine for patient to return my call before starting medication

## 2011-03-22 ENCOUNTER — Ambulatory Visit: Payer: Self-pay | Admitting: Family Medicine

## 2011-03-22 LAB — HM MAMMOGRAPHY: HM Mammogram: NORMAL

## 2011-03-28 ENCOUNTER — Encounter: Payer: Self-pay | Admitting: *Deleted

## 2011-03-28 ENCOUNTER — Encounter: Payer: Self-pay | Admitting: Family Medicine

## 2014-09-25 ENCOUNTER — Emergency Department: Payer: Self-pay | Admitting: Physician Assistant

## 2014-09-25 LAB — BASIC METABOLIC PANEL
ANION GAP: 9 (ref 7–16)
BUN: 14 mg/dL (ref 7–18)
CHLORIDE: 104 mmol/L (ref 98–107)
CO2: 27 mmol/L (ref 21–32)
Calcium, Total: 9.1 mg/dL (ref 8.5–10.1)
Creatinine: 0.76 mg/dL (ref 0.60–1.30)
EGFR (African American): >60
EGFR (Non-African Amer.): >60
Glucose: 92 mg/dL (ref 65–99)
OSMOLALITY: 280 (ref 275–301)
Potassium: 4.1 mmol/L (ref 3.5–5.1)
Sodium: 140 mmol/L (ref 136–145)

## 2014-09-25 LAB — CBC
HCT: 41.9 % (ref 35.0–47.0)
HGB: 14 g/dL (ref 12.0–16.0)
MCH: 31.2 pg (ref 26.0–34.0)
MCHC: 33.5 g/dL (ref 32.0–36.0)
MCV: 93 fL (ref 80–100)
PLATELETS: 228 10*3/uL (ref 150–440)
RBC: 4.49 10*6/uL (ref 3.80–5.20)
RDW: 12.5 % (ref 11.5–14.5)
WBC: 6.1 10*3/uL (ref 3.6–11.0)

## 2014-09-25 LAB — TROPONIN I

## 2014-09-25 LAB — D-DIMER(ARMC): D-DIMER: 645 ng/mL

## 2015-01-31 ENCOUNTER — Emergency Department
Admission: EM | Admit: 2015-01-31 | Discharge: 2015-02-01 | Disposition: A | Discharge status: Home / Self Care | Payer: 59 | Attending: Emergency Medicine | Admitting: Emergency Medicine

## 2015-01-31 DIAGNOSIS — Z880 Allergy status to penicillin: Secondary | ICD-10-CM | POA: Diagnosis not present

## 2015-01-31 DIAGNOSIS — Z79899 Other long term (current) drug therapy: Secondary | ICD-10-CM | POA: Diagnosis not present

## 2015-01-31 DIAGNOSIS — Z87891 Personal history of nicotine dependence: Secondary | ICD-10-CM | POA: Insufficient documentation

## 2015-01-31 DIAGNOSIS — R1030 Lower abdominal pain, unspecified: Secondary | ICD-10-CM

## 2015-01-31 DIAGNOSIS — Z91040 Latex allergy status: Secondary | ICD-10-CM | POA: Diagnosis not present

## 2015-01-31 DIAGNOSIS — Z3202 Encounter for pregnancy test, result negative: Secondary | ICD-10-CM | POA: Diagnosis not present

## 2015-01-31 LAB — CBC WITH DIFFERENTIAL/PLATELET
Basophils Absolute: 0 10*3/uL (ref 0–0.1)
Basophils Relative: 1 %
EOS ABS: 0.1 10*3/uL (ref 0–0.7)
Eosinophils Relative: 1 %
HEMATOCRIT: 40.9 % (ref 35.0–47.0)
Hemoglobin: 13.7 g/dL (ref 12.0–16.0)
Lymphocytes Relative: 33 %
Lymphs Abs: 2 10*3/uL (ref 1.0–3.6)
MCH: 31.5 pg (ref 26.0–34.0)
MCHC: 33.4 g/dL (ref 32.0–36.0)
MCV: 94.3 fL (ref 80.0–100.0)
Monocytes Absolute: 0.4 10*3/uL (ref 0.2–0.9)
Monocytes Relative: 7 %
Neutro Abs: 3.5 10*3/uL (ref 1.4–6.5)
Neutrophils Relative %: 58 %
Platelets: 207 10*3/uL (ref 150–440)
RBC: 4.34 MIL/uL (ref 3.80–5.20)
RDW: 12.5 % (ref 11.5–14.5)
WBC: 5.9 10*3/uL (ref 3.6–11.0)

## 2015-01-31 LAB — URINALYSIS COMPLETE WITH MICROSCOPIC (ARMC ONLY)
Bilirubin Urine: NEGATIVE
Glucose, UA: NEGATIVE mg/dL
HGB URINE DIPSTICK: NEGATIVE
Ketones, ur: NEGATIVE mg/dL
Leukocytes, UA: NEGATIVE
NITRITE: NEGATIVE
PH: 6 (ref 5.0–8.0)
Protein, ur: NEGATIVE mg/dL
Specific Gravity, Urine: 1.004 — ABNORMAL LOW (ref 1.005–1.030)

## 2015-01-31 LAB — BASIC METABOLIC PANEL
Anion gap: 5 (ref 5–15)
BUN: 11 mg/dL (ref 6–20)
CALCIUM: 9.3 mg/dL (ref 8.9–10.3)
CO2: 30 mmol/L (ref 22–32)
Chloride: 104 mmol/L (ref 101–111)
Creatinine, Ser: 0.62 mg/dL (ref 0.44–1.00)
GFR calc non Af Amer: >60 mL/min (ref >60)
GLUCOSE: 105 mg/dL — AB (ref 65–99)
Potassium: 3.3 mmol/L — ABNORMAL LOW (ref 3.5–5.1)
SODIUM: 139 mmol/L (ref 135–145)

## 2015-01-31 MED ORDER — IOHEXOL 240 MG/ML SOLN
25.0000 mL | Freq: Once | INTRAMUSCULAR | Status: AC | PRN
  Administered 2015-01-31: 25 mL via ORAL

## 2015-01-31 MED ORDER — ONDANSETRON HCL 4 MG/2ML IJ SOLN
4.0000 mg | Freq: Once | INTRAMUSCULAR | Status: AC
  Administered 2015-02-01: 4 mg via INTRAVENOUS

## 2015-01-31 MED ORDER — MORPHINE SULFATE 4 MG/ML IJ SOLN
4.0000 mg | Freq: Once | INTRAMUSCULAR | Status: AC
  Administered 2015-02-01: 4 mg via INTRAVENOUS

## 2015-01-31 NOTE — ED Notes (Signed)
Pt in with co lower abd pain x 2 days, no dysuria, no fever.

## 2015-01-31 NOTE — ED Provider Notes (Signed)
[facility] Emergency Department Provider Note  ____________________________________________  Time seen: Approximately 11:26 PM  I have reviewed the triage vital signs and the nursing notes.   HISTORY  Chief Complaint Abdominal Pain    HPI Caitlin Gallagher is a 44 y.o. female who comes in today with severe abdominal pain. The patient reports that she woke up at 3 AM yesterday with severe cramping abdominal pain. The patient took 800 mg of Motrin and after 2 hours fell back asleep. The patient reports that the same thing happened this morning and was able to resolved with Motrin. She reports that this evening the pain returned at approximate 1900 and she took 400 mg of Motrin which did not help the pain. The patient reports that she has not had a period in 2 years and has had a tubal ligation and endometrial ablation but she feels as though she is having contractions. The patient reports that she feels nauseous when she has these episodes but no vomiting diarrhea constipation. The patient reports it is a cramping and squeezing pain. She reports that this pain in both sides of her lower abdomen. The patient reports that the pain is constant but worsens in episodes. She reports that her pain is currently a 5 out of 10 intensity.   Past Medical History  Diagnosis Date  . Allergy     Patient Active Problem List   Diagnosis Date Noted  . Menstrual migraine 02/11/2011  . Eye pain 02/05/2011  . Blurred vision 02/05/2011  . TRANSAMINASES, SERUM, ELEVATED 03/13/2010  . DEPRESSION/ANXIETY 01/09/2010  . ALLERGIC RHINITIS 02/10/2009  . ENDOMETRIOSIS 02/10/2009  . PAIN IN JOINT, MULTIPLE SITES 02/10/2009  . 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 Tubal ligation    . Cesarean section    . Leep    . Miscarrige    . Groin cyst      Current Outpatient Rx  Name  Route  Sig  Dispense  Refill  . Multiple  Vitamins-Calcium (DAILY VITAMINS FOR WOMEN) TABS   Oral   Take 2 tablets by mouth 2 (two) times daily.           . Probiotic Product (SOLUBLE FIBER/PROBIOTICS PO)   Oral   Take by mouth daily.           . SUMAtriptan (IMITREX) 100 MG tablet      1 tablet as needed for migraine, may repeat in 2 hours x 1 if not improved. Max 200 mg in 24 hours.   10 tablet   0   . EXPIRED: topiramate (TOPAMAX) 25 MG tablet   Oral   Take 1 tablet (25 mg total) by mouth 2 (two) times daily.   60 tablet   0   . traMADol (ULTRAM) 50 MG tablet   Oral   Take 1 tablet (50 mg total) by mouth every 6 (six) hours as needed.   12 tablet   0     Allergies Butorphanol tartrate; Versed; Amoxicillin-pot clavulanate; Aspirin; Codeine; Erythromycin; Latex; and Thimerosal  Family History  Problem Relation Age of Onset  . Hyperlipidemia Mother   . Mental illness Mother   . Hyperlipidemia Father   . HIV Father   . Heart attack Maternal Grandmother   . Coronary artery disease Maternal Grandmother   . Cancer Maternal Grandmother     CANCER  . Hypertension Maternal Grandfather   . Cancer Maternal Grandfather  PANCREATIC  . Diabetes Paternal Grandmother     Social History History  Substance Use Topics  . Smoking status: Former Research scientist (life sciences)  . Smokeless tobacco: Not on file  . Alcohol Use: Yes    Review of Systems Constitutional: No fever/chills Eyes: No visual changes. ENT: No sore throat. Cardiovascular: Denies chest pain. Respiratory: Denies shortness of breath. Gastrointestinal: Abdominal pain and nausea no vomiting Genitourinary: Negative for dysuria. Musculoskeletal: Negative for back pain. Skin: Negative for rash. Neurological: Negative for headaches 10-point ROS otherwise negative.  ____________________________________________   PHYSICAL EXAM:  VITAL SIGNS: ED Triage Vitals  Enc Vitals Group     BP 01/31/15 2137 149/73 mmHg     Pulse Rate 01/31/15 2137 86     Resp 01/31/15  2137 18     Temp 01/31/15 2137 98.6 F (37 C)     Temp Source 01/31/15 2137 Oral     SpO2 01/31/15 2137 100 %     Weight 01/31/15 2137 125 lb (56.7 kg)     Height 01/31/15 2137 5' (1.524 m)     Head Cir --      Peak Flow --      Pain Score 01/31/15 2137 7     Pain Loc --      Pain Edu? --      Excl. in [place]? --     Constitutional: Alert and oriented. Severe distress. Eyes: Conjunctivae are normal. PERRL. EOMI. Head: Atraumatic. Nose: No congestion/rhinnorhea. Mouth/Throat: Mucous membranes are moist.  Oropharynx non-erythematous. Cardiovascular: Normal rate, regular rhythm. Grossly normal heart sounds.  Good peripheral circulation. Respiratory: Normal respiratory effort.  No retractions. Lungs CTAB. Gastrointestinal: Soft diffuse tenderness to palpation, positive bowel sounds Genitourinary: Deferred as patient with tubal ligation and suspension of menstrual cycle Musculoskeletal: No lower extremity tenderness nor edema.   Neurologic:  Normal speech and language. No gross focal neurologic deficits are appreciated.  Skin:  Skin is warm, dry and intact. No rash noted. Psychiatric: Mood and affect are normal.   ____________________________________________   LABS (all labs ordered are listed, but only abnormal results are displayed)  Labs Reviewed  BASIC METABOLIC PANEL - Abnormal; Notable for the following:    Potassium 3.3 (*)    Glucose, Bld 105 (*)    All other components within normal limits  URINALYSIS COMPLETEWITH MICROSCOPIC ([facility])  - Abnormal; Notable for the following:    Color, Urine STRAW (*)    APPearance CLEAR (*)    Specific Gravity, Urine 1.004 (*)    Bacteria, UA RARE (*)    Squamous Epithelial / LPF 0-5 (*)    All other components within normal limits  CBC WITH DIFFERENTIAL/PLATELET  PREGNANCY, URINE   ____________________________________________  EKG  None ____________________________________________  RADIOLOGY  CT abdomen and pelvis: No  explanation for patient's lower abdominal pain, inspected septate uterus, 2.6 cm left sided adnexal cyst ____________________________________________   PROCEDURES  Procedure(s) performed: None  Critical Care performed: No  ____________________________________________   INITIAL IMPRESSION / ASSESSMENT AND PLAN / ED COURSE  Pertinent labs & imaging results that were available during my care of the patient were reviewed by me and considered in my medical decision making (see chart for details).  This is a 44 year old female who comes in with severe abdominal pain. The patient reports it is cramping in nature and feels like contractions. The patient reports that the pain is all over but worse in her lower abdomen. I will perform a CT scan of the patient's abdomen to determine  the possible cause of her pain and reassess the patient once I received the results.  ----------------------------------------- 2:27 AM on 02/01/2015 -----------------------------------------  Discussed the results of the CT with the patient. I also give the patient a liter of normal saline as well as some morphine for her pain. The patient seems more comfortable currently after the medication and the fluids. I inform the patient that the CT was unremarkable except for some moderate colonic stool. The patient was disappointed that we could not figure out what was causing her pain but does understand the need to follow back up with her OB/GYN for further evaluation and treatment. ____________________________________________   FINAL CLINICAL IMPRESSION(S) / ED DIAGNOSES  Final diagnoses:  Lower abdominal pain      Loney Hering, MD 02/01/15 [phone]

## 2015-02-01 ENCOUNTER — Emergency Department: Payer: 59

## 2015-02-01 LAB — PREGNANCY, URINE: PREG TEST UR: NEGATIVE

## 2015-02-01 MED ORDER — MORPHINE SULFATE 4 MG/ML IJ SOLN
INTRAMUSCULAR | Status: AC
  Filled 2015-02-01: qty 1

## 2015-02-01 MED ORDER — SODIUM CHLORIDE 0.9 % IV BOLUS (SEPSIS)
1000.0000 mL | Freq: Once | INTRAVENOUS | Status: AC
  Administered 2015-02-01: 1000 mL via INTRAVENOUS

## 2015-02-01 MED ORDER — IOHEXOL 300 MG/ML  SOLN
100.0000 mL | Freq: Once | INTRAMUSCULAR | Status: AC | PRN
  Administered 2015-02-01: 100 mL via INTRAVENOUS

## 2015-02-01 MED ORDER — TRAMADOL HCL 50 MG PO TABS: 50.0000 mg | ORAL_TABLET | Freq: Four times a day (QID) | ORAL | Status: DC | PRN

## 2015-02-01 MED ORDER — ONDANSETRON HCL 4 MG/2ML IJ SOLN
INTRAMUSCULAR | Status: AC
  Filled 2015-02-01: qty 2

## 2015-02-01 NOTE — Discharge Instructions (Signed)
Abdominal Pain, Women °Abdominal (stomach, pelvic, or belly) pain can be caused by many things. It is important to tell your doctor: °· The location of the pain. °· Does it come and go or is it present all the time? °· Are there things that start the pain (eating certain foods, exercise)? °· Are there other symptoms associated with the pain (fever, nausea, vomiting, diarrhea)? °All of this is helpful to know when trying to find the cause of the pain. °CAUSES  °· Stomach: virus or bacteria infection, or ulcer. °· Intestine: appendicitis (inflamed appendix), regional ileitis (Crohn's disease), ulcerative colitis (inflamed colon), irritable bowel syndrome, diverticulitis (inflamed diverticulum of the colon), or cancer of the stomach or intestine. °· Gallbladder disease or stones in the gallbladder. °· Kidney disease, kidney stones, or infection. °· Pancreas infection or cancer. °· Fibromyalgia (pain disorder). °· Diseases of the female organs: °· Uterus: fibroid (non-cancerous) tumors or infection. °· Fallopian tubes: infection or tubal pregnancy. °· Ovary: cysts or tumors. °· Pelvic adhesions (scar tissue). °· Endometriosis (uterus lining tissue growing in the pelvis and on the pelvic organs). °· Pelvic congestion syndrome (female organs filling up with blood just before the menstrual period). °· Pain with the menstrual period. °· Pain with ovulation (producing an egg). °· Pain with an IUD (intrauterine device, birth control) in the uterus. °· Cancer of the female organs. °· Functional pain (pain not caused by a disease, may improve without treatment). °· Psychological pain. °· Depression. °DIAGNOSIS  °Your doctor will decide the seriousness of your pain by doing an examination. °· Blood tests. °· X-rays. °· Ultrasound. °· CT scan (computed tomography, special type of X-ray). °· MRI (magnetic resonance imaging). °· Cultures, for infection. °· Barium enema (dye inserted in the large intestine, to better view it with  X-rays). °· Colonoscopy (looking in intestine with a lighted tube). °· Laparoscopy (minor surgery, looking in abdomen with a lighted tube). °· Major abdominal exploratory surgery (looking in abdomen with a large incision). °TREATMENT  °The treatment will depend on the cause of the pain.  °· Many cases can be observed and treated at home. °· Over-the-counter medicines recommended by your caregiver. °· Prescription medicine. °· Antibiotics, for infection. °· Birth control pills, for painful periods or for ovulation pain. °· Hormone treatment, for endometriosis. °· Nerve blocking injections. °· Physical therapy. °· Antidepressants. °· Counseling with a psychologist or psychiatrist. °· Minor or major surgery. °HOME CARE INSTRUCTIONS  °· Do not take laxatives, unless directed by your caregiver. °· Take over-the-counter pain medicine only if ordered by your caregiver. Do not take aspirin because it can cause an upset stomach or bleeding. °· Try a clear liquid diet (broth or water) as ordered by your caregiver. Slowly move to a bland diet, as tolerated, if the pain is related to the stomach or intestine. °· Have a thermometer and take your temperature several times a day, and record it. °· Bed rest and sleep, if it helps the pain. °· Avoid sexual intercourse, if it causes pain. °· Avoid stressful situations. °· Keep your follow-up appointments and tests, as your caregiver orders. °· If the pain does not go away with medicine or surgery, you may try: °¨ Acupuncture. °¨ Relaxation exercises (yoga, meditation). °¨ Group therapy. °¨ Counseling. °SEEK MEDICAL CARE IF:  °· You notice certain foods cause stomach pain. °· Your home care treatment is not helping your pain. °· You need stronger pain medicine. °· You want your IUD removed. °· You feel faint or   lightheaded. °· You develop nausea and vomiting. °· You develop a rash. °· You are having side effects or an allergy to your medicine. °SEEK IMMEDIATE MEDICAL CARE IF:  °· Your  pain does not go away or gets worse. °· You have a fever. °· Your pain is felt only in portions of the abdomen. The right side could possibly be appendicitis. The left lower portion of the abdomen could be colitis or diverticulitis. °· You are passing blood in your stools (bright red or black tarry stools, with or without vomiting). °· You have blood in your urine. °· You develop chills, with or without a fever. °· You pass out. °MAKE SURE YOU:  °· Understand these instructions. °· Will watch your condition. °· Will get help right away if you are not doing well or get worse. °Document Released: 06/23/2007 Document Revised: 01/10/2014 Document Reviewed: 07/13/2009 °ExitCare® Patient Information ©2015 [employer]. This information is not intended to replace advice given to you by your health care provider. Make sure you discuss any questions you have with your health care provider. ° °Abdominal Pain °Many things can cause abdominal pain. Usually, abdominal pain is not caused by a disease and will improve without treatment. It can often be observed and treated at home. Your health care provider will do a physical exam and possibly order blood tests and X-rays to help determine the seriousness of your pain. However, in many cases, more time must pass before a clear cause of the pain can be found. Before that point, your health care provider may not know if you need more testing or further treatment. °HOME CARE INSTRUCTIONS  °Monitor your abdominal pain for any changes. The following actions may help to alleviate any discomfort you are experiencing: °· Only take over-the-counter or prescription medicines as directed by your health care provider. °· Do not take laxatives unless directed to do so by your health care provider. °· Try a clear liquid diet (broth, tea, or water) as directed by your health care provider. Slowly move to a bland diet as tolerated. °SEEK MEDICAL CARE IF: °· You have unexplained abdominal  pain. °· You have abdominal pain associated with nausea or diarrhea. °· You have pain when you urinate or have a bowel movement. °· You experience abdominal pain that wakes you in the night. °· You have abdominal pain that is worsened or improved by eating food. °· You have abdominal pain that is worsened with eating fatty foods. °· You have a fever. °SEEK IMMEDIATE MEDICAL CARE IF:  °· Your pain does not go away within 2 hours. °· You keep throwing up (vomiting). °· Your pain is felt only in portions of the abdomen, such as the right side or the left lower portion of the abdomen. °· You pass bloody or black tarry stools. °MAKE SURE YOU: °· Understand these instructions.   °· Will watch your condition.   °· Will get help right away if you are not doing well or get worse.   °Document Released: 06/05/2005 Document Revised: 08/31/2013 Document Reviewed: 05/05/2013 °ExitCare® Patient Information ©2015 [employer]. This information is not intended to replace advice given to you by your health care provider. Make sure you discuss any questions you have with your health care provider. ° °

## 2015-03-03 ENCOUNTER — Other Ambulatory Visit: Payer: Self-pay | Admitting: Adult Health

## 2015-03-03 DIAGNOSIS — Z1231 Encounter for screening mammogram for malignant neoplasm of breast: Secondary | ICD-10-CM

## 2015-03-09 ENCOUNTER — Ambulatory Visit: Payer: 59

## 2015-03-17 ENCOUNTER — Ambulatory Visit
Admission: RE | Admit: 2015-03-17 | Discharge: 2015-03-17 | Disposition: A | Discharge status: Home / Self Care | Payer: 59 | Source: Ambulatory Visit | Attending: Family Medicine | Admitting: Family Medicine

## 2015-03-17 DIAGNOSIS — Z1231 Encounter for screening mammogram for malignant neoplasm of breast: Secondary | ICD-10-CM | POA: Diagnosis present

## 2015-03-17 HISTORY — DX: Malignant (primary) neoplasm, unspecified: C80.1

## 2015-08-16 ENCOUNTER — Ambulatory Visit
Admission: RE | Admit: 2015-08-16 | Discharge: 2015-08-16 | Disposition: A | Discharge status: Home / Self Care | Payer: 59 | Source: Ambulatory Visit | Attending: Surgery | Admitting: Surgery

## 2015-08-16 ENCOUNTER — Ambulatory Visit: Payer: 59 | Admitting: Anesthesiology

## 2015-08-16 ENCOUNTER — Encounter
Admission: RE | Disposition: A | Discharge status: Home / Self Care | Payer: Self-pay | Source: Ambulatory Visit | Attending: Surgery

## 2015-08-16 DIAGNOSIS — Z87891 Personal history of nicotine dependence: Secondary | ICD-10-CM | POA: Diagnosis not present

## 2015-08-16 DIAGNOSIS — Z818 Family history of other mental and behavioral disorders: Secondary | ICD-10-CM | POA: Diagnosis not present

## 2015-08-16 DIAGNOSIS — Z830 Family history of human immunodeficiency virus [HIV] disease: Secondary | ICD-10-CM | POA: Diagnosis not present

## 2015-08-16 DIAGNOSIS — Z9851 Tubal ligation status: Secondary | ICD-10-CM | POA: Diagnosis not present

## 2015-08-16 DIAGNOSIS — Z90721 Acquired absence of ovaries, unilateral: Secondary | ICD-10-CM | POA: Insufficient documentation

## 2015-08-16 DIAGNOSIS — Z8249 Family history of ischemic heart disease and other diseases of the circulatory system: Secondary | ICD-10-CM | POA: Insufficient documentation

## 2015-08-16 DIAGNOSIS — Z833 Family history of diabetes mellitus: Secondary | ICD-10-CM | POA: Insufficient documentation

## 2015-08-16 DIAGNOSIS — Z8042 Family history of malignant neoplasm of prostate: Secondary | ICD-10-CM | POA: Insufficient documentation

## 2015-08-16 DIAGNOSIS — M67431 Ganglion, right wrist: Secondary | ICD-10-CM | POA: Insufficient documentation

## 2015-08-16 DIAGNOSIS — Z800 Family history of malignant neoplasm of digestive organs: Secondary | ICD-10-CM | POA: Diagnosis not present

## 2015-08-16 HISTORY — PX: GANGLION CYST EXCISION: SHX1691

## 2015-08-16 HISTORY — DX: Adverse effect of unspecified anesthetic, initial encounter: T41.45XA

## 2015-08-16 HISTORY — DX: Other complications of anesthesia, initial encounter: T88.59XA

## 2015-08-16 SURGERY — EXCISION, GANGLION CYST, WRIST
Anesthesia: Monitor Anesthesia Care | Site: Hand | Laterality: Right | Wound class: Clean

## 2015-08-16 MED ORDER — METOCLOPRAMIDE HCL 5 MG PO TABS: 5.0000 mg | ORAL_TABLET | Freq: Three times a day (TID) | ORAL | Status: DC | PRN

## 2015-08-16 MED ORDER — LACTATED RINGERS IV SOLN
INTRAVENOUS | Status: DC
  Administered 2015-08-16: 12:00:00 via INTRAVENOUS

## 2015-08-16 MED ORDER — POTASSIUM CHLORIDE IN NACL 20-0.9 MEQ/L-% IV SOLN
INTRAVENOUS | Status: DC
Start: 2015-08-16 — End: 2015-08-16

## 2015-08-16 MED ORDER — PROPOFOL 500 MG/50ML IV EMUL
INTRAVENOUS | Status: DC | PRN
  Administered 2015-08-16: 100 ug/kg/min via INTRAVENOUS

## 2015-08-16 MED ORDER — LIDOCAINE HCL (PF) 0.5 % IJ SOLN
INTRAMUSCULAR | Status: DC | PRN
  Administered 2015-08-16: 25 mL via INTRAVENOUS

## 2015-08-16 MED ORDER — OXYCODONE HCL 5 MG/5ML PO SOLN: 5.0000 mg | Freq: Once | ORAL | Status: DC | PRN

## 2015-08-16 MED ORDER — METOCLOPRAMIDE HCL 5 MG/ML IJ SOLN
5.0000 mg | Freq: Three times a day (TID) | INTRAMUSCULAR | Status: DC | PRN
Start: 2015-08-16 — End: 2015-08-16

## 2015-08-16 MED ORDER — TRAMADOL HCL 50 MG PO TABS
50.0000 mg | ORAL_TABLET | Freq: Four times a day (QID) | ORAL | Status: DC | PRN
  Administered 2015-08-16: 50 mg via ORAL

## 2015-08-16 MED ORDER — PROMETHAZINE HCL 25 MG/ML IJ SOLN
6.2500 mg | INTRAMUSCULAR | Status: DC | PRN
Start: 2015-08-16 — End: 2015-08-16

## 2015-08-16 MED ORDER — FENTANYL CITRATE (PF) 100 MCG/2ML IJ SOLN: 25.0000 ug | INTRAMUSCULAR | Status: DC | PRN

## 2015-08-16 MED ORDER — ONDANSETRON HCL 4 MG/2ML IJ SOLN
INTRAMUSCULAR | Status: DC | PRN
  Administered 2015-08-16: 4 mg via INTRAVENOUS

## 2015-08-16 MED ORDER — OXYCODONE HCL 5 MG PO TABS: 5.0000 mg | ORAL_TABLET | Freq: Once | ORAL | Status: DC | PRN

## 2015-08-16 MED ORDER — ONDANSETRON HCL 4 MG/2ML IJ SOLN: 4.0000 mg | Freq: Four times a day (QID) | INTRAMUSCULAR | Status: DC | PRN

## 2015-08-16 MED ORDER — TRAMADOL HCL 50 MG PO TABS: 50.0000 mg | ORAL_TABLET | Freq: Four times a day (QID) | ORAL | Status: DC | PRN

## 2015-08-16 MED ORDER — BUPIVACAINE HCL (PF) 0.5 % IJ SOLN
INTRAMUSCULAR | Status: DC | PRN
  Administered 2015-08-16: 7 mL

## 2015-08-16 MED ORDER — CEFAZOLIN SODIUM-DEXTROSE 2-3 GM-% IV SOLR
2.0000 g | Freq: Once | INTRAVENOUS | Status: AC
  Administered 2015-08-16: 2 g via INTRAVENOUS

## 2015-08-16 MED ORDER — ONDANSETRON HCL 4 MG PO TABS: 4.0000 mg | ORAL_TABLET | Freq: Four times a day (QID) | ORAL | Status: DC | PRN

## 2015-08-16 SURGICAL SUPPLY — 37 items
ADH LQ OCL WTPRF AMP STRL LF (MISCELLANEOUS) ×1
ADHESIVE MASTISOL STRL (MISCELLANEOUS) ×2 IMPLANT
BANDAGE ELASTIC 2 VELCRO NS LF (GAUZE/BANDAGES/DRESSINGS) ×2 IMPLANT
BANDAGE ELASTIC 3 VELCRO NS (GAUZE/BANDAGES/DRESSINGS) IMPLANT
BANDAGE ELASTIC 4 VELCRO NS (GAUZE/BANDAGES/DRESSINGS) IMPLANT
BNDG COHESIVE 4X5 TAN STRL (GAUZE/BANDAGES/DRESSINGS) ×3 IMPLANT
BNDG ESMARK 4X12 TAN STRL LF (GAUZE/BANDAGES/DRESSINGS) ×3 IMPLANT
CHLORAPREP W/TINT 26ML (MISCELLANEOUS) ×3 IMPLANT
CLOSURE WOUND 1/4X4 (GAUZE/BANDAGES/DRESSINGS) ×1
CORD BIP STRL DISP 12FT (MISCELLANEOUS) IMPLANT
COVER LIGHT HANDLE FLEXIBLE (MISCELLANEOUS) ×3 IMPLANT
CUFF TOURN SGL QUICK 18 (TOURNIQUET CUFF) IMPLANT
CUFF TOURNIQUET DUAL PORT 18X3 (MISCELLANEOUS) ×2 IMPLANT
DRAPE U-SHAPE 48X52 POLY STRL (PACKS) ×2 IMPLANT
GAUZE PETRO XEROFOAM 1X8 (MISCELLANEOUS) ×3 IMPLANT
GAUZE SPONGE 4X4 12PLY STRL (GAUZE/BANDAGES/DRESSINGS) ×3 IMPLANT
GLOVE BIO SURGEON STRL SZ8 (GLOVE) ×2 IMPLANT
GLOVE BIOGEL PI IND STRL 7.0 (GLOVE) IMPLANT
GLOVE BIOGEL PI IND STRL 8 (GLOVE) IMPLANT
GLOVE BIOGEL PI INDICATOR 7.0 (GLOVE) ×2
GLOVE BIOGEL PI INDICATOR 8 (GLOVE) ×2
GLOVE INDICATOR 8.0 STRL GRN (GLOVE) ×1 IMPLANT
GOWN STRL REUS W/ TWL LRG LVL3 (GOWN DISPOSABLE) ×1 IMPLANT
GOWN STRL REUS W/ TWL XL LVL3 (GOWN DISPOSABLE) ×1 IMPLANT
GOWN STRL REUS W/TWL LRG LVL3 (GOWN DISPOSABLE) ×3
GOWN STRL REUS W/TWL XL LVL3 (GOWN DISPOSABLE) ×3
KIT ROOM TURNOVER OR (KITS) ×3 IMPLANT
NS IRRIG 500ML POUR BTL (IV SOLUTION) ×3 IMPLANT
PACK EXTREMITY ARMC (MISCELLANEOUS) ×3 IMPLANT
PAD GROUND ADULT SPLIT (MISCELLANEOUS) ×1 IMPLANT
STOCKINETTE 4 (MISCELLANEOUS) ×2 IMPLANT
STRAP BODY AND KNEE 60X3 (MISCELLANEOUS) ×3 IMPLANT
STRIP CLOSURE SKIN 1/4X4 (GAUZE/BANDAGES/DRESSINGS) ×1 IMPLANT
SUT PROLENE 4 0 PS 2 18 (SUTURE) IMPLANT
SUT VIC AB 3-0 SH 27 (SUTURE)
SUT VIC AB 3-0 SH 27X BRD (SUTURE) IMPLANT
SUT VIC AB 4-0 FS2 27 (SUTURE) ×2 IMPLANT

## 2015-08-16 NOTE — Transfer of Care (Signed)
Immediate Anesthesia Transfer of Care Note  Patient: Caitlin Gallagher  Procedure(s) Performed: Procedure(s): REMOVAL GANGLION OF WRIST (Right)  Patient Location: PACU  Anesthesia Type: MAC, Bier Block  Level of Consciousness: awake, alert  and patient cooperative  Airway and Oxygen Therapy: Patient Spontanous Breathing and Patient connected to supplemental oxygen  Post-op Assessment: Post-op Vital signs reviewed, Patient's Cardiovascular Status Stable, Respiratory Function Stable, Patent Airway and No signs of Nausea or vomiting  Post-op Vital Signs: Reviewed and stable  Complications: No apparent anesthesia complications

## 2015-08-16 NOTE — Discharge Instructions (Signed)
General Anesthesia, Adult, Care After Refer to this sheet in the next few weeks. These instructions provide you with information on caring for yourself after your procedure. Your health care provider may also give you more specific instructions. Your treatment has been planned according to current medical practices, but problems sometimes occur. Call your health care provider if you have any problems or questions after your procedure. WHAT TO EXPECT AFTER THE PROCEDURE After the procedure, it is typical to experience:  Sleepiness.  Nausea and vomiting. HOME CARE INSTRUCTIONS  For the first 24 hours after general anesthesia:  Have a responsible person with you.  Do not drive a car. If you are alone, do not take public transportation.  Do not drink alcohol.  Do not take medicine that has not been prescribed by your health care provider.  Do not sign important papers or make important decisions.  You may resume a normal diet and activities as directed by your health care provider.  Change bandages (dressings) as directed.  If you have questions or problems that seem related to general anesthesia, call the hospital and ask for the anesthetist or anesthesiologist on call. SEEK MEDICAL CARE IF:  You have nausea and vomiting that continue the day after anesthesia.  You develop a rash. SEEK IMMEDIATE MEDICAL CARE IF:   You have difficulty breathing.  You have chest pain.  You have any allergic problems.   This information is not intended to replace advice given to you by your health care provider. Make sure you discuss any questions you have with your health care provider.   Document Released: 12/02/2000 Document Revised: 09/16/2014 Document Reviewed: 12/25/2011 Elsevier Interactive Patient Education 2016 [employer].  Keep dressing dry and intact. Keep hand elevated above heart level. May shower after dressing removed on postop day 4 (Sunday). Cover sutures with Band-Aid  after drying off. Apply ice to affected area frequently. Return for follow-up in 10-14 days or as scheduled.

## 2015-08-16 NOTE — H&P (Signed)
Paper H&P to be scanned into permanent record. H&P reviewed. No changes. 

## 2015-08-16 NOTE — Anesthesia Procedure Notes (Addendum)
Procedure Name: MAC Performed by: Mayme Genta Pre-anesthesia Checklist: Patient identified, Emergency Drugs available, Suction available, Timeout performed and Patient being monitored Patient Re-evaluated:Patient Re-evaluated prior to inductionOxygen Delivery Method: Nasal cannula Placement Confirmation: positive ETCO2   Anesthesia Regional Block:  Bier block (IV Regional)  Pre-Anesthetic Checklist: ,, timeout performed, Correct Patient, Correct Site, Correct Laterality, Correct Procedure, Correct Position, site marked, Risks and benefits discussed,  Surgical consent,  Pre-op evaluation,  At surgeon's request and post-op pain management  Laterality: Right     Needles:  Injection technique: Catheter      Additional Needles: Bier block (IV Regional) Narrative:  Start time: 08/16/2015 12:27 PM End time: 08/16/2015 12:29 PM Injection made incrementally with aspirations every 5 mL.  Performed by: With CRNAs  Anesthesiologist: [facility], SCOTT  Additional Notes: R arm exangiunated with esmarch prior to tourniquet inflation. Tolerated well by pt.

## 2015-08-16 NOTE — Anesthesia Postprocedure Evaluation (Signed)
Anesthesia Post Note  Patient: Caitlin Gallagher  Procedure(s) Performed: Procedure(s) (LRB): REMOVAL GANGLION OF WRIST (Right)  Patient location during evaluation: PACU Anesthesia Type: General Level of consciousness: awake and alert Pain management: pain level controlled Vital Signs Assessment: post-procedure vital signs reviewed and stable Respiratory status: spontaneous breathing, nonlabored ventilation, respiratory function stable and patient connected to nasal cannula oxygen Cardiovascular status: blood pressure returned to baseline and stable Postop Assessment: no signs of nausea or vomiting Anesthetic complications: no    Marshell Levan

## 2015-08-16 NOTE — Progress Notes (Signed)
Attempted to start a 22G in right hand and patient began screaming and yelling for nurses to stop. IV taken out

## 2015-08-16 NOTE — Op Note (Signed)
08/16/2015  12:58 PM  Patient:   Caitlin Gallagher  Pre-Op Diagnosis:   Right dorsal carpal ganglion.  Post-Op Diagnosis:   Same.  Procedure:   Excision of right dorsal carpal ganglion.  Surgeon:   Pascal Lux, MD  Anesthesia:   Bier block  Findings:   As above.  Complications:   None  EBL:   0 cc  Fluids:   550 cc crystalloid  TT:   27 minutes at 250 mmHg  Drains:   None  Closure:   4-0 Vicryl subcuticular sutures  Brief Clinical Note:   The patient is a 44 year old female with a several month history of a painful soft tissue mass on the dorsal aspect of her right wrist. Her symptoms have persisted despite medications, activity modification, etc. Her history and examination were consistent with a dorsal carpal ganglion. She presents at this time for excision of the right dorsal carpal ganglion.  Procedure:   The patient was brought into the operating room and lain in the supine position. After adequate IV sedation was achieved, a Bier block was placed by the anesthesiologist and the tourniquet inflated to 250 mmHg. The right hand and upper extremity were prepped with DuraPrep solution before being draped sterilely. Preoperative antibiotics were administered. An approximate 1.5-2 cm incision was made transversely over the cyst in line with the wrist extension crease. The incision was carried down through the subcutaneous tissues with care taken to identify and protect any neurovascular structures. The distal portion of the extensor retinaculum was incised to permit further access to the cyst. The tendons were dissected radially and ulnarly in order to dissect down onto the cyst. Once the cyst was identified, it was dissected out circumferentially, tracking it down to the stalk, before it was removed. During its removal, the cyst ruptured, releasing approximately 0.5 cc of a straw-colored gelatinous material, consistent with a ganglion cyst. The cyst was tracked down to the carpus.  A small amount of capsule was removed from the carpus in order to try to minimize the likelihood of it recurring.  The wound was copiously irrigated with sterile saline solution before the skin was closed using 4-0 Vicryl subcuticular sutures. Benzoin and Steri-Strips were applied to the skin before a sterile bulky dressing was applied to the wrist. The patient was then awakened and returned to the recovery room in satisfactory condition after tolerating the procedure well.

## 2015-08-16 NOTE — Anesthesia Preprocedure Evaluation (Addendum)
Anesthesia Evaluation  Patient identified by MRN, date of birth, ID band  Reviewed: Allergy & Precautions, NPO status , Patient's Chart, lab work & pertinent test results, reviewed documented beta blocker date and time   Airway Mallampati: I  TM Distance: >3 FB Neck ROM: Full    Dental   Pulmonary former smoker,           Cardiovascular      Neuro/Psych  Headaches,    GI/Hepatic   Endo/Other    Renal/GU      Musculoskeletal   Abdominal   Peds  Hematology   Anesthesia Other Findings   Reproductive/Obstetrics                            Anesthesia Physical Anesthesia Plan  ASA: II  Anesthesia Plan: MAC and Bier Block   Post-op Pain Management:    Induction: Intravenous  Airway Management Planned:   Additional Equipment:   Intra-op Plan:   Post-operative Plan:   Informed Consent: I have reviewed the patients History and Physical, chart, labs and discussed the procedure including the risks, benefits and alternatives for the proposed anesthesia with the patient or authorized representative who has indicated his/her understanding and acceptance.     Plan Discussed with: CRNA  Anesthesia Plan Comments:         Anesthesia Quick Evaluation

## 2015-08-17 ENCOUNTER — Encounter: Payer: Self-pay | Admitting: Surgery

## 2015-08-18 LAB — SURGICAL PATHOLOGY

## 2015-08-30 ENCOUNTER — Ambulatory Visit: Payer: 59 | Attending: Surgery | Admitting: Occupational Therapy

## 2015-08-30 ENCOUNTER — Encounter: Payer: Self-pay | Admitting: Occupational Therapy

## 2015-08-30 DIAGNOSIS — R29898 Other symptoms and signs involving the musculoskeletal system: Secondary | ICD-10-CM | POA: Diagnosis present

## 2015-08-30 DIAGNOSIS — M67431 Ganglion, right wrist: Secondary | ICD-10-CM | POA: Diagnosis not present

## 2015-08-30 DIAGNOSIS — L905 Scar conditions and fibrosis of skin: Secondary | ICD-10-CM | POA: Insufficient documentation

## 2015-08-30 NOTE — Patient Instructions (Signed)
Patient instructed in the following: (SEE HEP sheet) summary below Scar management: use of silicone gel sheet with tubigrip, scar massage, desensitization, precautions provided Desensitization: use of massage and textures, provided velfoam, use of pressure using other hand. ROM : Wrist Extension: use prayer stretch, 4x/day to tolerance, 5-10 reps, 5 seconds for each rep, Progress to PROM in Ext: to place hold, can perform wall push ups to tolerance Wrist Flexion: reach down on arm chair, or resting on ulnar side of hand to tolerance: 4x/day, 5-10 reps, Can perform this exercise with fingers out or in a fist (as per patient comfort) Putty: blue: gripping, rolling, pinching, and pulling

## 2015-08-31 NOTE — Addendum Note (Signed)
Addended by: Preston Fleeting on: 08/31/2015 08:47 AM   Modules accepted: Orders

## 2015-08-31 NOTE — Therapy (Addendum)
[facility] [address], [place], [address] Phone: [phone]   Fax:  [phone]  Occupational Therapy Evaluation  Patient Details  Name: Caitlin Gallagher MRN: [id number] Date of Birth: [birth year] No Data Recorded  Encounter Date: 08/30/2015      OT End of Session - 08/30/15 1148    Visit Number 1   Number of Visits 12   Date for OT Re-Evaluation 09/30/15   OT Start Time 0900   OT Stop Time 1000   OT Time Calculation (min) 60 min   Equipment Utilized During Treatment Lotion, light blue putty, velfoam, silicone 1"x 1", tubigrip 8" , fluidotherapy for desens   Activity Tolerance Patient tolerated treatment well   Behavior During Therapy [facility] for tasks assessed/performed      Past Medical History  Diagnosis Date  . Allergy   . Cancer ([place])     cervical  . Complication of anesthesia     hard to wake up    Past Surgical History  Procedure Laterality Date  . Tubal ligation    . Cesarean section    . Leep    . Miscarrige    . Groin cyst    . Ganglion cyst excision Right 08/16/2015    Procedure: REMOVAL GANGLION OF WRIST;  Surgeon: Corky Mull, MD;  Location: [facility];  Service: Orthopedics;  Laterality: Right;    There were no vitals filed for this visit.  Visit Diagnosis:  Ganglion of right wrist - Plan: Ot plan of care cert/re-cert      Subjective Assessment - 08/30/15 0910    Subjective  had surgery 12/7 for a ganglion cyst of right wrist and have had severe pain with motion since, also nerve pain on back of my hand from needle stick on surgery day   Pertinent History Years of ganglion cyst , back 15 yrs prior that has been limitting ROM at right wrist. pain increased in past 6 months, proceeded to have surgery ion 08/16/2015   Limitations ROM and pain at right wrist   Repetition Increases Symptoms   Patient Stated Goals able to write and perform self care, work out without  increased wrist pain   Currently in Pain? Yes   Pain Score 5    Pain Location Wrist   Pain Orientation Right;Other (Comment)   Pain Descriptors / Indicators Aching;Burning   Pain Type Surgical pain   Pain Radiating Towards fingers and deep into wrist   Pain Onset 1 to 4 weeks ago   Pain Frequency Constant   Aggravating Factors  using the right hand    Pain Relieving Factors rest   Effect of Pain on Daily Activities limitting ability to do things around the house, for self care, work out   Multiple Pain Sites No       Therapeutic Ex: AROM at right wrist and digits, AAROM HEP reviewed : focus on wrist flexion/extension. Addressed workout routine restrictions for present.                      OT Education - 08/30/15 1147    Education provided Yes   Education Details HEP   Person(s) Educated Patient   Methods Explanation;Demonstration;Verbal cues;Handout   Comprehension Verbalized understanding;Returned demonstration          OT Short Term Goals - 08/30/15 1641    OT SHORT TERM GOAL #1   Title Increase Rt. Wrist  ROM in  flexion by 15 degrees to be able to pick items up for household chores   Baseline AROM flexion is 30 degrees at Rt, 85 at left   Time 1   Period Weeks   Status New   OT SHORT TERM GOAL #2   Title Increase extension at Rt wrist ROM by 20 degrees to increase ease of doning and doffing pull over shirts   Baseline Rt ext: 40 , left is 80   Time 1   Status New   OT SHORT TERM GOAL #3   Title return to gym routine lifting weights , gripping   Baseline able to perform aerobic routines   Time 4   Period Weeks   Status New   OT SHORT TERM GOAL #4   Title tolerate scar massage to dorsal surface of right wrist   Baseline deep pressure only   Time 1   Period Weeks   Status New   OT SHORT TERM GOAL #5   Title Able to grasp self care items with no pain   Time 2   Period Weeks   Status New           OT Long Term Goals - 08/30/15 1648     OT LONG TERM GOAL #1   Title Increase ROM in flexion and extension  to perform daily routines with no pain   Time 4   Period Weeks   Status New               Plan - 08/30/15 1150    Clinical Impression Statement Patient has increased pain at right wrist and hypersensitive scar since surgery on 08/16/2015 to remove dorsal ganglion cyst, has reduced range of motion at right wrist in flexion and extension, as limitted by her pain   Pt will benefit from skilled therapeutic intervention in order to improve on the following deficits (Retired) Decreased range of motion;Impaired UE functional use;Pain   Rehab Potential Excellent   OT Frequency 2x / week   OT Duration 6 weeks   OT Treatment/Interventions Therapeutic exercise;Scar mobilization;Fluidtherapy   Plan Desensitization, scar management, AROM, PRE, functional use as tolerated   OT Home Exercise Plan scar management, desens, AROM wrist, functional use   Consulted and Agree with Plan of Care Patient        Problem List Patient Active Problem List   Diagnosis Date Noted  . Menstrual migraine 02/11/2011  . Eye pain 02/05/2011  . Blurred vision 02/05/2011  . TRANSAMINASES, SERUM, ELEVATED 03/13/2010  . DEPRESSION/ANXIETY 01/09/2010  . ALLERGIC RHINITIS 02/10/2009  . ENDOMETRIOSIS 02/10/2009  . PAIN IN JOINT, MULTIPLE SITES 02/10/2009  . CARCINOMA IN SITU, CERVIX 12/09/1998    Shanvi Moyd OTR/L 08/31/2015, 8:47 AM  [place] [address], [place], [address] Phone: [phone]   Fax:  [phone]  Name: ROBIN BUTTS MRN: [id number] Date of Birth: 1970/09/12

## 2015-09-05 ENCOUNTER — Encounter: Payer: 59 | Admitting: Occupational Therapy

## 2015-09-06 ENCOUNTER — Encounter: Payer: Self-pay | Admitting: Occupational Therapy

## 2015-09-06 ENCOUNTER — Ambulatory Visit: Payer: 59 | Admitting: Occupational Therapy

## 2015-09-06 DIAGNOSIS — L905 Scar conditions and fibrosis of skin: Secondary | ICD-10-CM

## 2015-09-06 DIAGNOSIS — M25639 Stiffness of unspecified wrist, not elsewhere classified: Secondary | ICD-10-CM

## 2015-09-06 DIAGNOSIS — M67431 Ganglion, right wrist: Secondary | ICD-10-CM | POA: Diagnosis not present

## 2015-09-06 NOTE — Therapy (Signed)
[facility] [address], [place], [address] Phone: [phone]   Fax:  [phone]  Occupational Therapy Treatment  Patient Details  Name: Caitlin Gallagher MRN: [id number] Date of Birth: 1970/09/15 No Data Recorded  Encounter Date: 09/06/2015      OT End of Session - 09/06/15 1029    Visit Number 2   Number of Visits 12   Date for OT Re-Evaluation 09/30/15   OT Start Time 1000   OT Stop Time 1035   OT Time Calculation (min) 35 min   Equipment Utilized During Treatment 2 # weight   Activity Tolerance Patient tolerated treatment well   Behavior During Therapy [facility] for tasks assessed/performed      Past Medical History  Diagnosis Date  . Allergy   . Cancer ([place])     cervical  . Complication of anesthesia     hard to wake up    Past Surgical History  Procedure Laterality Date  . Tubal ligation    . Cesarean section    . Leep    . Miscarrige    . Groin cyst    . Ganglion cyst excision Right 08/16/2015    Procedure: REMOVAL GANGLION OF WRIST;  Surgeon: Corky Mull, MD;  Location: [facility];  Service: Orthopedics;  Laterality: Right;    There were no vitals filed for this visit.  Visit Diagnosis:  Decreased range of motion of wrist  Scar irritation      Subjective Assessment - 09/06/15 0959    Subjective  At the gym doing UE work out with weights, did my wrist work as well.  thngs loosened up, but stiffened up after.  Scar less sensitive on the surface. Pressure still bothering me.    Repetition Decreases Symptoms   Patient Stated Goals able to write and perform self care, work out without increased wrist pain   Currently in Pain? Yes   Pain Score 3    Pain Location Wrist   Pain Orientation Right   Pain Descriptors / Indicators Aching   Pain Type Surgical pain   Pain Onset 1 to 4 weeks ago            [facility] OT Assessment - 09/06/15 0001    AROM   Right Wrist Extension  60 Degrees   Right Wrist Flexion 85 Degrees                  OT Treatments/Exercises (OP) - 09/06/15 0001    Neurological Re-education Exercises   Wrist Flexion AROM;PROM;AAROM;10 reps;Bar weights/barbell  2 # weight in flexion and extension resting on elbow x10   Wrist Extension PROM;AROM;AAROM;10 reps   Wrist Radial Deviation AROM;5 reps;Bar weights/barbell  2# x 10   Wrist Ulnar Deviation AROM;5 reps;Bar weights/barbell  2# x 10 reps   Manual Therapy   Manual therapy comments joint mobilization and scar management                  OT Short Term Goals - 08/30/15 1641    OT SHORT TERM GOAL #1   Title Increase Rt. Wrist  ROM in flexion by 15 degrees to be able to pick items up for household chores   Baseline AROM flexion is 30 degrees at Rt, 85 at left   Time 1   Period Weeks   Status New   OT SHORT TERM GOAL #2   Title Increase extension at Rt wrist ROM by  20 degrees to increase ease of doning and doffing pull over shirts   Baseline Rt ext: 40 , left is 80   Time 1   Status New   OT SHORT TERM GOAL #3   Title return to gym routine lifting weights , gripping   Baseline able to perform aerobic routines   Time 4   Period Weeks   Status New   OT SHORT TERM GOAL #4   Title tolerate scar massage to dorsal surface of right wrist   Baseline deep pressure only   Time 1   Period Weeks   Status New   OT SHORT TERM GOAL #5   Title Able to grasp self care items with no pain   Time 2   Period Weeks   Status New           OT Long Term Goals - 08/30/15 1648    OT LONG TERM GOAL #1   Title Increase ROM in flexion and extension  to perform daily routines with no pain   Time 4   Period Weeks   Status New               Plan - 09/06/15 1037    Clinical Impression Statement Increased ROM and functional use with RUE, still has some ROM limits, strength limits and Hypersensitve scar that will benefit from therapeutic intervention   Pt will benefit  from skilled therapeutic intervention in order to improve on the following deficits (Retired) Decreased range of motion;Impaired UE functional use;Pain;Other (comment)  Hypersensitive scar at dorsal right wrist   Rehab Potential Excellent   OT Frequency 2x / week   OT Duration 6 weeks   OT Treatment/Interventions Therapeutic exercise;Scar mobilization;Fluidtherapy;Other (comment)  scar desensitization   Plan AROM, AAROM, added 2# weight to wrist routine, added pinching rolling to dorsal scar tissue   OT Home Exercise Plan scar management, desens, AROM wrist, functional use     O: tolerated all AROM<, AAROM, PROM at right wrist. Increased AROM signficantly in flexion and extension.  Attempted joint mobilization of right wrist: while grasping patient by hand and forearm, began to apply gentle tension: Increased pain to a 10/10 at scar: "felt like tearing at the scar" Followed with patient performing scar desensitization routine and wrist pain calmed down.  A: dorsal scar tissue is exquisitely painful with deep pressure and tension applied to wrist. AROM increases excellent, use increased.  P: patient to increase desensitization of right wrist scar tissue.   Problem List Patient Active Problem List   Diagnosis Date Noted  . Menstrual migraine 02/11/2011  . Eye pain 02/05/2011  . Blurred vision 02/05/2011  . TRANSAMINASES, SERUM, ELEVATED 03/13/2010  . DEPRESSION/ANXIETY 01/09/2010  . ALLERGIC RHINITIS 02/10/2009  . ENDOMETRIOSIS 02/10/2009  . PAIN IN JOINT, MULTIPLE SITES 02/10/2009  . CARCINOMA IN SITU, CERVIX 12/09/1998    Amber Williard  OTR/L 09/06/2015, 10:43 AM  [facility] [address] [place], [address] Phone: [phone]   Fax:  [phone]  Name: Caitlin Gallagher MRN: [id number] Date of Birth: 1970/11/01

## 2015-09-13 ENCOUNTER — Ambulatory Visit: Payer: BLUE CROSS/BLUE SHIELD | Attending: Surgery | Admitting: Occupational Therapy

## 2015-09-13 ENCOUNTER — Encounter: Payer: Self-pay | Admitting: Occupational Therapy

## 2015-09-13 DIAGNOSIS — L905 Scar conditions and fibrosis of skin: Secondary | ICD-10-CM | POA: Diagnosis present

## 2015-09-13 DIAGNOSIS — M25639 Stiffness of unspecified wrist, not elsewhere classified: Secondary | ICD-10-CM

## 2015-09-13 DIAGNOSIS — M67431 Ganglion, right wrist: Secondary | ICD-10-CM | POA: Insufficient documentation

## 2015-09-13 DIAGNOSIS — R29898 Other symptoms and signs involving the musculoskeletal system: Secondary | ICD-10-CM | POA: Insufficient documentation

## 2015-09-13 NOTE — Therapy (Signed)
[facility] [address] [place], [address] Phone: [phone]   Fax:  [phone]  Occupational Therapy Treatment  Patient Details  Name: Caitlin Gallagher MRN: [id number] Date of Birth: 26-Oct-1970 No Data Recorded  Encounter Date: 09/13/2015      OT End of Session - 09/13/15 1150    Visit Number 3   Number of Visits 12   Date for OT Re-Evaluation 09/30/15   OT Start Time 1113   OT Stop Time 1147   OT Time Calculation (min) 34 min   Equipment Utilized During Treatment 3# weight and dark blue putty   Activity Tolerance Patient tolerated treatment well;Patient limited by pain   Behavior During Therapy [facility] for tasks assessed/performed      Past Medical History  Diagnosis Date  . Allergy   . Cancer ([place])     cervical  . Complication of anesthesia     hard to wake up    Past Surgical History  Procedure Laterality Date  . Tubal ligation    . Cesarean section    . Leep    . Miscarrige    . Groin cyst    . Ganglion cyst excision Right 08/16/2015    Procedure: REMOVAL GANGLION OF WRIST;  Surgeon: Corky Mull, MD;  Location: [place];  Service: Orthopedics;  Laterality: Right;    There were no vitals filed for this visit.  Visit Diagnosis:  Decreased range of motion of wrist  Scar irritation  Ganglion of right wrist      Subjective Assessment - 09/13/15 1116    Subjective  "I have gotten my motion back and am lifting weights at the gym. It's the pain at the scar and doing combined motion, like unhooking my bra" Rotation at the wrist, right.   Pertinent History Years of ganglion cyst , back 15 yrs prior that has been limitting ROM at right wrist. pain increased in past 6 months, proceeded to have surgery ion 08/16/2015   Limitations ROM and pain at right wrist   Repetition Decreases Symptoms   Patient Stated Goals able to write and perform self care, work out without increased wrist  pain   Currently in Pain? Yes   Pain Score 4    Pain Location Wrist   Pain Orientation Right   Pain Descriptors / Indicators Aching   Pain Type Surgical pain   Pain Onset 1 to 4 weeks ago   Pain Frequency Constant   Aggravating Factors  Using right hand   Pain Relieving Factors rest   Effect of Pain on Daily Activities Limiting ability to perform daily activities and self care, work out   Multiple Pain Sites No                      OT Treatments/Exercises (OP) - 09/13/15 0001    ADLs   ADL Comments Removing her bra and shaving legs cont to be difficulty- anything involving wrist rotation and flexion.   Exercises   Exercises Wrist;Hand   Wrist Exercises   Wrist Flexion AROM;PROM;AAROM;10 reps;Bar weights/barbell  3#   Wrist Extension PROM;AROM;AAROM;10 reps  3#   Wrist Radial Deviation AROM;5 reps;Bar weights/barbell  3-4#   Wrist Ulnar Deviation AROM;5 reps;Bar weights/barbell  3-4 #    Other wrist exercises Wrist exerciser for grip and rotation at wrist to assist with increased AROM for activities such as fastening bra and writing etc.  3# weight  x10 reps   Additional Wrist Exercises   Theraputty - Grip Increased resistance of putty to dark blue and simulated grip and twist motions of wrist x5 reps    Modalities   Modalities --  Pt unable to tolerate 2* to increased hypersensitivity scar   Manual Therapy   Manual therapy comments joint mobilization and scar management  Pt with hypersensitive scar right wrist, increased pain 9/10       Pt reports that scar pad and compression sleeve is keeping her awake at noc and was educated to gradually increase use during day - wear for 30 min intervals off and on during day and gradually increase time/tolerance. This should assist with desensitization w/o keeping her awake at noc.  Increased resistance of putty and performed wrist rotation and grip (simulating fasten/unfastening bra) right hand.  Hand/wrist maze for  combined wrist movement and AROM. Increased weight for ex's from 2# to 3#.          OT Education - 09/13/15 1150    Education provided Yes   Education Details Upgrade home program   Person(s) Educated Patient   Methods Explanation;Demonstration;Verbal cues   Comprehension Verbalized understanding;Returned demonstration          OT Short Term Goals - 08/30/15 1641    OT SHORT TERM GOAL #1   Title Increase Rt. Wrist  ROM in flexion by 15 degrees to be able to pick items up for household chores   Baseline AROM flexion is 30 degrees at Rt, 85 at left   Time 1   Period Weeks   Status New   OT SHORT TERM GOAL #2   Title Increase extension at Rt wrist ROM by 20 degrees to increase ease of doning and doffing pull over shirts   Baseline Rt ext: 40 , left is 80   Time 1   Status New   OT SHORT TERM GOAL #3   Title return to gym routine lifting weights , gripping   Baseline able to perform aerobic routines   Time 4   Period Weeks   Status New   OT SHORT TERM GOAL #4   Title tolerate scar massage to dorsal surface of right wrist   Baseline deep pressure only   Time 1   Period Weeks   Status New   OT SHORT TERM GOAL #5   Title Able to grasp self care items with no pain   Time 2   Period Weeks   Status New           OT Long Term Goals - 08/30/15 1648    OT LONG TERM GOAL #1   Title Increase ROM in flexion and extension  to perform daily routines with no pain   Time 4   Period Weeks   Status New               Plan - 09/13/15 1151    Clinical Impression Statement Pt demonstrates increased AROM and overal lfunctional use of right hand/wrist. She cont with limitations with daily activities involving combined grip with wrist rotation for fastening bra etc. She is hypersensitive to pain with min scar management techniques and should benefit from out-pt therapy to address these impairments.   Pt will benefit from skilled therapeutic intervention in order to improve  on the following deficits (Retired) Decreased range of motion;Impaired UE functional use;Pain;Other (comment)   Rehab Potential Excellent   OT Frequency 2x / week   OT Duration 6 weeks  OT Treatment/Interventions Therapeutic exercise;Scar mobilization;Fluidtherapy;Other (comment)   OT Home Exercise Plan Assess HEP, scar mamangement, desensitization of scar to pt tolerance and AROM/funcitonal use right hand/dorsal wrist.   Consulted and Agree with Plan of Care Patient        Problem List Patient Active Problem List   Diagnosis Date Noted  . Menstrual migraine 02/11/2011  . Eye pain 02/05/2011  . Blurred vision 02/05/2011  . TRANSAMINASES, SERUM, ELEVATED 03/13/2010  . DEPRESSION/ANXIETY 01/09/2010  . ALLERGIC RHINITIS 02/10/2009  . ENDOMETRIOSIS 02/10/2009  . PAIN IN JOINT, MULTIPLE SITES 02/10/2009  . CARCINOMA IN SITU, CERVIX 12/09/1998    Carlynn Herald, Amy Beth Dixon, OTR/L 09/13/2015, 11:55 AM  [facility] [address], [place], [address] Phone: [phone]   Fax:  [phone]  Name: Caitlin Gallagher MRN: [id number] Date of Birth: 05-Jun-1971

## 2015-09-13 NOTE — Patient Instructions (Signed)
Pt reports that scar pad and compression sleeve is keeping her awake at noc and was educated to gradually increase use during day - wear for 30 min intervals off and on during day and gradually increase time/tolerance. This should assist with desensitization w/o keeping her awake at noc.  Increased resistance of putty and performed wrist rotation and grip (simulating fasten/unfastening bra) right hand.  Hand/wrist maze for combined wrist movement and AROM. Increased weight for ex's from 2# to 3#.

## 2015-09-15 ENCOUNTER — Encounter: Payer: 59 | Admitting: Occupational Therapy

## 2015-09-19 ENCOUNTER — Encounter: Payer: 59 | Admitting: Occupational Therapy

## 2015-09-20 ENCOUNTER — Ambulatory Visit: Payer: BLUE CROSS/BLUE SHIELD | Admitting: Occupational Therapy

## 2015-09-20 DIAGNOSIS — R29898 Other symptoms and signs involving the musculoskeletal system: Secondary | ICD-10-CM | POA: Diagnosis not present

## 2015-09-20 DIAGNOSIS — M67431 Ganglion, right wrist: Secondary | ICD-10-CM

## 2015-09-20 NOTE — Therapy (Signed)
[facility] [address], [place], [address] Phone: [phone]   Fax:  [phone]  Occupational Therapy Treatment  Patient Details  Name: Caitlin Gallagher MRN: [id number] Date of Birth: 16-Apr-1971 No Data Recorded  Encounter Date: 09/20/2015      OT End of Session - 09/20/15 1509    Visit Number 4   Number of Visits 12   Date for OT Re-Evaluation 09/30/15   OT Start Time 1202   OT Stop Time 1244   OT Time Calculation (min) 42 min   Activity Tolerance Patient tolerated treatment well;Patient limited by pain   Behavior During Therapy [facility] for tasks assessed/performed      Past Medical History  Diagnosis Date  . Allergy   . Cancer ([place])     cervical  . Complication of anesthesia     hard to wake up    Past Surgical History  Procedure Laterality Date  . Tubal ligation    . Cesarean section    . Leep    . Miscarrige    . Groin cyst    . Ganglion cyst excision Right 08/16/2015    Procedure: REMOVAL GANGLION OF WRIST;  Surgeon: Corky Mull, MD;  Location: [facility];  Service: Orthopedics;  Laterality: Right;    There were no vitals filed for this visit.  Visit Diagnosis:  Ganglion cyst of wrist, right      Subjective Assessment - 09/20/15 1456    Subjective  My pain is 5/10 constant since last week - I am working with trainer in gym and pushing seeing what I cannot do - or have trouble with - if doing combine wrist movement  touching it - I do the massage until I pain is 8/10   Patient Stated Goals able to write and perform self care, work out without increased wrist pain   Currently in Pain? Yes   Pain Score 4    Pain Location Wrist   Pain Orientation Right   Pain Descriptors / Indicators Aching   Pain Type Surgical pain   Pain Onset 1 to 4 weeks ago   Pain Frequency Constant                      OT Treatments/Exercises (OP) - 09/20/15 0001    Wrist Exercises    Other wrist exercises pain with wrist flexion with fist,; and fist combine with RD and UD ; 3rd digit extention tapping  feel pull , pain  with resistance    Modalities   Modalities --  5 min fist , neutral wrist, then wrist/digits flexion    Moist Heat Therapy   Number Minutes Moist Heat 10 Minutes   Moist Heat Location Wrist   Cryotherapy   Number Minutes Cryotherapy 8 Minutes   Cryotherapy Location Wrist   Type of Cryotherapy Ice pack   Manual Therapy   Manual therapy comments Scar massage done, rougher texture with towel, tapping on scar - tender and unable to do scar mobs -unable to tolerate scar pad or pressure                 OT Education - 09/20/15 1509    Education provided Yes   Education Details HEP   Person(s) Educated Patient   Methods Explanation;Demonstration;Verbal cues;Tactile cues   Comprehension Returned demonstration;Verbal cues required;Verbalized understanding          OT Short Term Goals - 09/20/15  [place] #1   Title Increase Rt. Wrist  ROM in flexion by 15 degrees to be able to pick items up for household chores   Time 1   Period Weeks   Status On-going   OT SHORT TERM GOAL #2   Title Increase extension at Rt wrist ROM by 20 degrees to increase ease of doning and doffing pull over shirts   Baseline Rt ext: 40 , left is 80   Time 2   Period Weeks   Status On-going   OT SHORT TERM GOAL #3   Title return to gym routine lifting weights , gripping   Baseline pain with dumbbell   Time 2   Period Weeks   Status On-going   OT SHORT TERM GOAL #4   Title tolerate scar massage to dorsal surface of right wrist   Time 2   Period Weeks   Status On-going   OT SHORT TERM GOAL #5   Title Able to grasp self care items with no pain   Time 2   Period Weeks   Status On-going           OT Long Term Goals - 09/20/15 1524    OT LONG TERM GOAL #1   Title Increase ROM in flexion and extension  to perform daily routines with no  pain   Time 2   Period Weeks   Status On-going               Plan - 09/20/15 1511    Clinical Impression Statement  Pt still show increase pain and decrease ROM for fist combine with wrist flexion/RD/UD - as well as increase hyper sensitivity of scar and hyper trophic - pt to keep pain under 3/10 with scar massage and HEP    Pt will benefit from skilled therapeutic intervention in order to improve on the following deficits (Retired) Decreased range of motion;Impaired UE functional use;Pain;Other (comment)   Rehab Potential Excellent   OT Frequency 2x / week   OT Duration 4 weeks   OT Treatment/Interventions Therapeutic exercise;Scar mobilization;Fluidtherapy;Other (comment)   Plan assess pain , scar and fist with wrist flexion   Consulted and Agree with Plan of Care Patient        Problem List Patient Active Problem List   Diagnosis Date Noted  . Menstrual migraine 02/11/2011  . Eye pain 02/05/2011  . Blurred vision 02/05/2011  . TRANSAMINASES, SERUM, ELEVATED 03/13/2010  . DEPRESSION/ANXIETY 01/09/2010  . ALLERGIC RHINITIS 02/10/2009  . ENDOMETRIOSIS 02/10/2009  . PAIN IN JOINT, MULTIPLE SITES 02/10/2009  . CARCINOMA IN SITU, CERVIX 12/09/1998    Rosalyn Gess OTR/L,CLT 09/20/2015, 3:25 PM  [facility] [address], [place], [address] Phone: [phone]   Fax:  [phone]  Name: Caitlin Gallagher MRN: [id number] Date of Birth: 01-21-1971

## 2015-09-20 NOTE — Patient Instructions (Signed)
Pt to cont with desentitization - but only 30 sec at time - and pain to keep under 3/10 pain   Wrist flexion with fist - stretch  After heat 30 sec few times a day - and AROM combination flexion   Keep pain under 3/10 during workouts and HEP  Do not push  do ice for pain

## 2015-09-21 ENCOUNTER — Encounter: Payer: 59 | Admitting: Occupational Therapy

## 2015-09-22 ENCOUNTER — Ambulatory Visit: Payer: BLUE CROSS/BLUE SHIELD | Admitting: Occupational Therapy

## 2015-09-22 DIAGNOSIS — M25639 Stiffness of unspecified wrist, not elsewhere classified: Secondary | ICD-10-CM

## 2015-09-22 DIAGNOSIS — L905 Scar conditions and fibrosis of skin: Secondary | ICD-10-CM

## 2015-09-22 DIAGNOSIS — R29898 Other symptoms and signs involving the musculoskeletal system: Secondary | ICD-10-CM | POA: Diagnosis not present

## 2015-09-22 DIAGNOSIS — M67431 Ganglion, right wrist: Secondary | ICD-10-CM

## 2015-09-22 NOTE — Therapy (Signed)
[facility] [address] [place], [address] Phone: [phone]   Fax:  [phone]  Occupational Therapy Treatment  Patient Details  Name: Caitlin Gallagher MRN: [id number] Date of Birth: 08-27-1971 No Data Recorded  Encounter Date: 09/22/2015      OT End of Session - 09/22/15 1120    Visit Number 5   Number of Visits 12   Date for OT Re-Evaluation 09/30/15   OT Start Time 1020   OT Stop Time 1050   OT Time Calculation (min) 30 min   Activity Tolerance Patient tolerated treatment well   Behavior During Therapy [facility] for tasks assessed/performed      Past Medical History  Diagnosis Date  . Allergy   . Cancer ([place])     cervical  . Complication of anesthesia     hard to wake up    Past Surgical History  Procedure Laterality Date  . Tubal ligation    . Cesarean section    . Leep    . Miscarrige    . Groin cyst    . Ganglion cyst excision Right 08/16/2015    Procedure: REMOVAL GANGLION OF WRIST;  Surgeon: Corky Mull, MD;  Location: [facility];  Service: Orthopedics;  Laterality: Right;    There were no vitals filed for this visit.  Visit Diagnosis:  Decreased range of motion of wrist  Scar irritation  Ganglion of right wrist      Subjective Assessment - 09/22/15 1113    Subjective  I did like you told me and did not push the limits in the gym to see what my wrist can do , and did not massage so hard and long - pain I can say is down to 0 again at rest - and when I do the massage or stretch I push it to 2-3/10 not 8/10 anymore - feels better    Patient Stated Goals able to write and perform self care, work out without increased wrist pain   Currently in Pain? Yes   Pain Score 1    Pain Location Wrist   Pain Orientation Right   Pain Descriptors / Indicators Aching   Pain Type Surgical pain                      OT Treatments/Exercises (OP) - 09/22/15 0001    Wrist Exercises   Other wrist exercises Wrist extnetion  flexion with fist 65 , open hand 88 degrees - after [place] did composite wrist flexion strethc 5 x 30 sec - improved 5 degrees without increase pain    Ultrasound   Ultrasound Location scar on dorsal wrist    Ultrasound Parameters CUS at 3.75mhz and .7 intensity  prrior to ROM and scar massage   Ultrasound Goals Pain   Manual Therapy   Manual therapy comments Scar not as teender as last time - pt able to tolerate massage, differenct textures and tapping - did apply kinesiotape this date to scar  for scar mobs during use of wrist - applied paralle 10% pull , and across scar 2 at 75-80% pull - pt able to tolerate in clinic it for 5-10 min - pt to remove if pain  increase again more than 4/10                 OT Education - 09/22/15 1120    Education provided Yes   Education Details see pt instruction  Person(s) Educated Patient   Methods Tactile cues;Verbal cues;Explanation;Demonstration   Comprehension Verbalized understanding;Returned demonstration;Verbal cues required          OT Short Term Goals - 09/20/15 1522    OT SHORT TERM GOAL #1   Title Increase Rt. Wrist  ROM in flexion by 15 degrees to be able to pick items up for household chores   Time 1   Period Weeks   Status On-going   OT SHORT TERM GOAL #2   Title Increase extension at Rt wrist ROM by 20 degrees to increase ease of doning and doffing pull over shirts   Baseline Rt ext: 40 , left is 80   Time 2   Period Weeks   Status On-going   OT SHORT TERM GOAL #3   Title return to gym routine lifting weights , gripping   Baseline pain with dumbbell   Time 2   Period Weeks   Status On-going   OT SHORT TERM GOAL #4   Title tolerate scar massage to dorsal surface of right wrist   Time 2   Period Weeks   Status On-going   OT SHORT TERM GOAL #5   Title Able to grasp self care items with no pain   Time 2   Period Weeks   Status On-going           OT Long  Term Goals - 09/20/15 1524    OT LONG TERM GOAL #1   Title Increase ROM in flexion and extension  to perform daily routines with no pain   Time 2   Period Weeks   Status On-going               Plan - 09/22/15 1120    Clinical Impression Statement Pt showed since last session decrease pain to 0/10 at rest - was the last 2 wks per pt 4-5/10 - pt had to be "pulled back in " for scar mobs and ther ex in the gym - pt ed on composite flexion stretch at wrist - 65 and open hand 88 - pt to cont with that and was able to toleratet this date application of kinestiotape to scar - pt to see DR Poggi on Monday    Pt will benefit from skilled therapeutic intervention in order to improve on the following deficits (Retired) Decreased range of motion;Impaired UE functional use;Pain;Other (comment)   Rehab Potential Excellent   OT Frequency 2x / week   OT Duration 4 weeks   OT Treatment/Interventions Therapeutic exercise;Scar mobilization;Fluidtherapy;Other (comment)   Plan assess pain and ROM composite flexion of wrsit    OT Home Exercise Plan see pt instruction    Consulted and Agree with Plan of Care Patient        Problem List Patient Active Problem List   Diagnosis Date Noted  . Menstrual migraine 02/11/2011  . Eye pain 02/05/2011  . Blurred vision 02/05/2011  . TRANSAMINASES, SERUM, ELEVATED 03/13/2010  . DEPRESSION/ANXIETY 01/09/2010  . ALLERGIC RHINITIS 02/10/2009  . ENDOMETRIOSIS 02/10/2009  . PAIN IN JOINT, MULTIPLE SITES 02/10/2009  . CARCINOMA IN SITU, CERVIX 12/09/1998    Rosalyn Gess OTR/L,CLT 09/22/2015, 11:24 AM  [facility] [address] [place], [address] Phone: [phone]   Fax:  [phone]  Name: Caitlin Gallagher MRN: [id number] Date of Birth: 04/17/1971

## 2015-09-22 NOTE — Patient Instructions (Signed)
See manual flow sheet for application of kinesiotape  - pt to apply at home as needed  Composite flexion of wrist and hand in fist 8-10 reps hold 30 sec  Pain to be less than 3/10

## 2015-09-27 ENCOUNTER — Ambulatory Visit: Payer: BLUE CROSS/BLUE SHIELD | Admitting: Occupational Therapy

## 2015-09-27 DIAGNOSIS — L905 Scar conditions and fibrosis of skin: Secondary | ICD-10-CM

## 2015-09-27 DIAGNOSIS — M67431 Ganglion, right wrist: Secondary | ICD-10-CM

## 2015-09-27 DIAGNOSIS — M25639 Stiffness of unspecified wrist, not elsewhere classified: Secondary | ICD-10-CM

## 2015-09-27 DIAGNOSIS — R29898 Other symptoms and signs involving the musculoskeletal system: Secondary | ICD-10-CM | POA: Diagnosis not present

## 2015-09-27 NOTE — Therapy (Signed)
[facility] [address], [place], [address] Phone: [phone]   Fax:  [phone]  Occupational Therapy Treatment  Patient Details  Name: Caitlin Gallagher MRN: [id number] Date of Birth: 18-Mar-1971 No Data Recorded  Encounter Date: 09/27/2015      OT End of Session - 09/27/15 1213    Visit Number 6   Number of Visits 12   Date for OT Re-Evaluation 09/30/15   OT Start Time 1115   OT Stop Time 1208   OT Time Calculation (min) 53 min   Activity Tolerance Patient tolerated treatment well;Patient limited by pain   Behavior During Therapy [facility] for tasks assessed/performed      Past Medical History  Diagnosis Date  . Allergy   . Cancer ([place])     cervical  . Complication of anesthesia     hard to wake up    Past Surgical History  Procedure Laterality Date  . Tubal ligation    . Cesarean section    . Leep    . Miscarrige    . Groin cyst    . Ganglion cyst excision Right 08/16/2015    Procedure: REMOVAL GANGLION OF WRIST;  Surgeon: Corky Mull, MD;  Location: [facility];  Service: Orthopedics;  Laterality: Right;    There were no vitals filed for this visit.  Visit Diagnosis:  Scar irritation  Decreased range of motion of wrist  Ganglion of right wrist      Subjective Assessment - 09/27/15 1148    Subjective  I seen DR Poggi Friday - he said to work that scar really hard - but since last Friday and to today - pain about 4-6/10 constantly - stabbing and shooting - I am just so discouraged    Patient Stated Goals able to write and perform self care, work out without increased wrist pain   Currently in Pain? Yes   Pain Score 4    Pain Location Wrist   Pain Orientation Right   Pain Descriptors / Indicators Aching;Stabbing   Pain Type Surgical pain   Pain Onset 1 to 4 weeks ago                      OT Treatments/Exercises (OP) - 09/27/15 0001    Wrist Exercises   Other wrist exercises Wrist flexion open and close hand same than last time - done composite flexion stetch after fluido and after CUS    Electrical Stimulation   Electrical Stimulation Location proximal and distal to scar on dorsal hand and forearm    Electrical Stimulation Action 6 current   Electrical Stimulation Parameters protocol for acture pain  for hand and wrist    Electrical Stimulation Goals Pain   Ultrasound   Ultrasound Location scar on dorsal wrist    Ultrasound Parameters CUS at 3.2mhz at 0.7 intensity    Ultrasound Goals Other (Comment)   RUE Fluidotherapy   Number Minutes Fluidotherapy 10 Minutes   RUE Fluidotherapy Location Wrist   Comments First 8 min no movement - did desentitization - then  AROM of wrist flexion with close fist - until feels pull    Manual Therapy   Manual therapy comments Scar mobs done to tolerance - and desentitization in fluidio                OT Education - 09/27/15 1213    Education provided Yes   Education Details pt edcuation  Person(s) Educated Patient   Methods Explanation;Demonstration   Comprehension Verbalized understanding;Returned demonstration          OT Short Term Goals - 09/20/15 1522    OT SHORT TERM GOAL #1   Title Increase Rt. Wrist  ROM in flexion by 15 degrees to be able to pick items up for household chores   Time 1   Period Weeks   Status On-going   OT SHORT TERM GOAL #2   Title Increase extension at Rt wrist ROM by 20 degrees to increase ease of doning and doffing pull over shirts   Baseline Rt ext: 40 , left is 80   Time 2   Period Weeks   Status On-going   OT SHORT TERM GOAL #3   Title return to gym routine lifting weights , gripping   Baseline pain with dumbbell   Time 2   Period Weeks   Status On-going   OT SHORT TERM GOAL #4   Title tolerate scar massage to dorsal surface of right wrist   Time 2   Period Weeks   Status On-going   OT SHORT TERM GOAL #5   Title Able to grasp self care  items with no pain   Time 2   Period Weeks   Status On-going           OT Long Term Goals - 09/20/15 1524    OT LONG TERM GOAL #1   Title Increase ROM in flexion and extension  to perform daily routines with no pain   Time 2   Period Weeks   Status On-going               Plan - 09/27/15 1215    Clinical Impression Statement Pt showed up this date with increase pain - contant 4-6/10 - since last time - did tolerate tape until Friday evening - but after that was worse - did see DR Pogg - to cont with therpay- done mostly pain control this date to  decrease it to what is was lat time - and pt to cont with increase composite flexion and scar mobs to toleratnce    Pt will benefit from skilled therapeutic intervention in order to improve on the following deficits (Retired) Decreased range of motion;Impaired UE functional use;Pain;Other (comment)   Rehab Potential Excellent   OT Frequency 1x / week   OT Duration 4 weeks   OT Treatment/Interventions Therapeutic exercise;Scar mobilization;Fluidtherapy;Other (comment)   Plan assess pain and HEP    OT Home Exercise Plan see pt instruction    Consulted and Agree with Plan of Care Patient        Problem List Patient Active Problem List   Diagnosis Date Noted  . Menstrual migraine 02/11/2011  . Eye pain 02/05/2011  . Blurred vision 02/05/2011  . TRANSAMINASES, SERUM, ELEVATED 03/13/2010  . DEPRESSION/ANXIETY 01/09/2010  . ALLERGIC RHINITIS 02/10/2009  . ENDOMETRIOSIS 02/10/2009  . PAIN IN JOINT, MULTIPLE SITES 02/10/2009  . CARCINOMA IN SITU, CERVIX 12/09/1998    Caitlin Gallagher OTR/L,CLT 09/27/2015, 12:18 PM  [facility] [address] [place], [address] Phone: [phone]   Fax:  [phone]  Name: Caitlin Gallagher MRN: [id number] Date of Birth: June 09, 1971

## 2015-09-27 NOTE — Patient Instructions (Signed)
Same as last - heat composite flexion of wrist with fist  Ice at end  Scar mobs to tolerance and desentitization

## 2015-10-02 ENCOUNTER — Ambulatory Visit: Payer: BLUE CROSS/BLUE SHIELD | Admitting: Occupational Therapy

## 2015-10-02 DIAGNOSIS — R29898 Other symptoms and signs involving the musculoskeletal system: Secondary | ICD-10-CM | POA: Diagnosis not present

## 2015-10-02 DIAGNOSIS — M67431 Ganglion, right wrist: Secondary | ICD-10-CM

## 2015-10-02 DIAGNOSIS — L905 Scar conditions and fibrosis of skin: Secondary | ICD-10-CM

## 2015-10-02 DIAGNOSIS — M25639 Stiffness of unspecified wrist, not elsewhere classified: Secondary | ICD-10-CM

## 2015-10-02 NOTE — Therapy (Signed)
[facility] [address], [place], [address] Phone: [phone]   Fax:  [phone]  Occupational Therapy Treatment  Patient Details  Name: Caitlin Gallagher MRN: [id number] Date of Birth: 1971-01-15 No Data Recorded  Encounter Date: 10/02/2015      OT End of Session - 10/02/15 1254    Visit Number 7   Number of Visits 12   Date for OT Re-Evaluation 10/20/15   OT Start Time 1201   OT Stop Time 1234   OT Time Calculation (min) 33 min   Activity Tolerance Patient tolerated treatment well;Patient limited by pain   Behavior During Therapy [facility] for tasks assessed/performed      Past Medical History  Diagnosis Date  . Allergy   . Cancer ([place])     cervical  . Complication of anesthesia     hard to wake up    Past Surgical History  Procedure Laterality Date  . Tubal ligation    . Cesarean section    . Leep    . Miscarrige    . Groin cyst    . Ganglion cyst excision Right 08/16/2015    Procedure: REMOVAL GANGLION OF WRIST;  Surgeon: Corky Mull, MD;  Location: [facility];  Service: Orthopedics;  Laterality: Right;    There were no vitals filed for this visit.  Visit Diagnosis:  Scar irritation  Decreased range of motion of wrist  Ganglion of right wrist      Subjective Assessment - 10/02/15 1247    Subjective  I was sick for 3 days and in bed - feel good and no pain really - but then I did not do a lot -    Patient Stated Goals able to write and perform self care, work out without increased wrist pain   Currently in Pain? No/denies                      OT Treatments/Exercises (OP) - 10/02/15 0001    Wrist Exercises   Other wrist exercises Wrist flexion with composite fist 65 - increase pain past that    Electrical Stimulation   Electrical Stimulation Location proximal and distal to scar on dorsal hand and forearm    Electrical Stimulation Action 6.2 current    Electrical Stimulation Parameters protocol for acture pain for hand and wrist    Electrical Stimulation Goals Pain   RUE [place]affin   Number Minutes [place]affin 10 Minutes   RUE [place]affin Location Wrist   Comments hand in fist and attempted with wrist in slight flexion with heatingpad to increase flexion  and stretch on scar - pt not able to tolerate flexion - or weight of heatingpad     Manual Therapy   Manual therapy comments Reed pt on scar massage and mobs - in all direction - but need to be short periods and maybe 5 x day  - and in wrist flexion position - for scar to be tight - attemped xtrator and vibration - pt unable to tolerate                 OT Education - 10/02/15 1254    Education provided Yes   Education Details pt ed on scar mobs and pain control   Person(s) Educated Patient   Methods Explanation;Demonstration   Comprehension Verbalized understanding;Returned demonstration          OT Short Term Goals - 09/20/15 1522  OT SHORT TERM GOAL #1   Title Increase Rt. Wrist  ROM in flexion by 15 degrees to be able to pick items up for household chores   Time 1   Period Weeks   Status On-going   OT SHORT TERM GOAL #2   Title Increase extension at Rt wrist ROM by 20 degrees to increase ease of doning and doffing pull over shirts   Baseline Rt ext: 40 , left is 80   Time 2   Period Weeks   Status On-going   OT SHORT TERM GOAL #3   Title return to gym routine lifting weights , gripping   Baseline pain with dumbbell   Time 2   Period Weeks   Status On-going   OT SHORT TERM GOAL #4   Title tolerate scar massage to dorsal surface of right wrist   Time 2   Period Weeks   Status On-going   OT SHORT TERM GOAL #5   Title Able to grasp self care items with no pain   Time 2   Period Weeks   Status On-going           OT Long Term Goals - 09/20/15 1524    OT LONG TERM GOAL #1   Title Increase ROM in flexion and extension  to perform daily routines with no pain    Time 2   Period Weeks   Status On-going               Plan - 10/02/15 1255    Clinical Impression Statement Pt showing no progress in wrist flexion with composite fist - report feels like tear if bending wrist with fist - pt reed on scar mobs and massage - see pt instruciton - pt to cont 2 wks at home and contact me - pt unable to ttolerae  stretches in clinic and scar mobs    Pt will benefit from skilled therapeutic intervention in order to improve on the following deficits (Retired) Decreased range of motion;Impaired UE functional use;Pain;Other (comment)   Rehab Potential Good   OT Frequency 1x / week   OT Duration 4 weeks   OT Treatment/Interventions Therapeutic exercise;Scar mobilization;Fluidtherapy;Other (comment)   Plan pt to phone in 2 wks    OT Home Exercise Plan see pt instruction    Consulted and Agree with Plan of Care Patient        Problem List Patient Active Problem List   Diagnosis Date Noted  . Menstrual migraine 02/11/2011  . Eye pain 02/05/2011  . Blurred vision 02/05/2011  . TRANSAMINASES, SERUM, ELEVATED 03/13/2010  . DEPRESSION/ANXIETY 01/09/2010  . ALLERGIC RHINITIS 02/10/2009  . ENDOMETRIOSIS 02/10/2009  . PAIN IN JOINT, MULTIPLE SITES 02/10/2009  . CARCINOMA IN SITU, CERVIX 12/09/1998    Rosalyn Gess  OTR/L,CLT  10/02/2015, 12:57 PM  [facility] [address], [place], [address] Phone: [phone]   Fax:  [phone]  Name: DNAE KURMAN MRN: [id number] Date of Birth: 1970-10-12

## 2015-10-02 NOTE — Patient Instructions (Signed)
Pt to do scar massage and mobs for 5 x day - and start with 10 sec x 5 and increase gradually  Wrist in flexion with hand in fist - to put scar on stretch  And heat prior   Try ice afterwards for pain  To do for 2 wks - and phone me

## 2015-10-30 DIAGNOSIS — M67439 Ganglion, unspecified wrist: Secondary | ICD-10-CM | POA: Insufficient documentation

## 2016-07-17 ENCOUNTER — Ambulatory Visit (INDEPENDENT_AMBULATORY_CARE_PROVIDER_SITE_OTHER): Payer: BLUE CROSS/BLUE SHIELD | Admitting: Obstetrics and Gynecology

## 2016-07-17 ENCOUNTER — Encounter: Payer: Self-pay | Admitting: Obstetrics and Gynecology

## 2016-07-17 VITALS — BP 130/72 | HR 85 | Ht 60.5 in | Wt 122.6 lb

## 2016-07-17 DIAGNOSIS — N951 Menopausal and female climacteric states: Secondary | ICD-10-CM | POA: Diagnosis not present

## 2016-07-17 DIAGNOSIS — Z780 Asymptomatic menopausal state: Secondary | ICD-10-CM | POA: Diagnosis not present

## 2016-07-17 DIAGNOSIS — Z803 Family history of malignant neoplasm of breast: Secondary | ICD-10-CM | POA: Diagnosis not present

## 2016-07-17 DIAGNOSIS — Z9889 Other specified postprocedural states: Secondary | ICD-10-CM | POA: Diagnosis not present

## 2016-07-17 DIAGNOSIS — Z9851 Tubal ligation status: Secondary | ICD-10-CM | POA: Diagnosis not present

## 2016-07-17 DIAGNOSIS — Z1231 Encounter for screening mammogram for malignant neoplasm of breast: Secondary | ICD-10-CM

## 2016-07-17 DIAGNOSIS — Z01419 Encounter for gynecological examination (general) (routine) without abnormal findings: Secondary | ICD-10-CM | POA: Diagnosis not present

## 2016-07-17 DIAGNOSIS — Z90721 Acquired absence of ovaries, unilateral: Secondary | ICD-10-CM

## 2016-07-17 DIAGNOSIS — Z1239 Encounter for other screening for malignant neoplasm of breast: Secondary | ICD-10-CM

## 2016-07-17 NOTE — Progress Notes (Signed)
GYN ANNUAL PREVENTATIVE CARE ENCOUNTER NOTE  Subjective:       Caitlin Gallagher is a 45 y.o. [phone] female here for a routine annual gynecologic exam.  Current complaints:  1. Establish care  OB history notable for spontaneous vaginal delivery, C-section for preterm twins, and D&C for spontaneous abortion. GYN history notable for history of cervical dysplasia, status post LEEP cone biopsy in 1999; no further abnormalities since. Status post endometrial ablation with amenorrhea state. Status post BTL and laparoscopic RSO for cystic ovary. Family history is notable for rest cancer in maternal grandmother and maternal great aunt; pancreatic cancer in maternal grandfather and maternal great grandmother. Patient does not experiencing any vasomotor symptoms at this time; she did have them over the past 4 years. She does experience vaginal dryness for which she uses lubricants   Gynecologic History No LMP recorded. Patient has had an ablation. Contraception: tubal ligation Last Pap: Negative/- 02/09/2015  Last mammogram: Negative-BI-RADS 1 03/17/2015  Obstetric History OB History  Gravida Para Term Preterm AB Living  3 2 1 1 1 3   SAB TAB Ectopic Multiple Live Births  1     1 3     # Outcome Date GA Lbr Len/2nd Weight Sex Delivery Anes PTL Lv  3A Preterm 08/05/02 [redacted]w[redacted]d  4 lb (1.814 kg) M CS-Unspec  Y LIV  3B Preterm    2 lb 13 oz (1.276 kg) F CS-Unspec  Y LIV  2 SAB 09/2001 [redacted]w[redacted]d       ND  1 Term 01/16/00   4 lb 15 oz (2.24 kg) F Vag-Spont  N LIV      Past Medical History:  Diagnosis Date  . Allergy   . Cancer ([place])    cervical  . Complication of anesthesia    hard to wake up    Past Surgical History:  Procedure Laterality Date  . CESAREAN SECTION    . GANGLION CYST EXCISION Right 08/16/2015   Procedure: REMOVAL GANGLION OF WRIST;  Surgeon: Corky Mull, MD;  Location: [place];  Service: Orthopedics;  Laterality: Right;  . ganglion cyst removed from rt. wrist    .  GROIN CYST    . LEEP    . MISCARRIGE    . TUBAL LIGATION      Current Outpatient Prescriptions on File Prior to Visit  Medication Sig Dispense Refill  . Multiple Vitamins-Calcium (DAILY VITAMINS FOR WOMEN) TABS Take 2 tablets by mouth 2 (two) times daily.      . Probiotic Product (SOLUBLE FIBER/PROBIOTICS PO) Take by mouth daily.      . SUMAtriptan (IMITREX) 100 MG tablet 1 tablet as needed for migraine, may repeat in 2 hours x 1 if not improved. Max 200 mg in 24 hours. (Patient not taking: Reported on 08/11/2015) 10 tablet 0  . topiramate (TOPAMAX) 25 MG tablet Take 1 tablet (25 mg total) by mouth 2 (two) times daily. 60 tablet 0  . traMADol (ULTRAM) 50 MG tablet Take 1 tablet (50 mg total) by mouth every 6 (six) hours as needed for moderate pain. 40 tablet 0   No current facility-administered medications on file prior to visit.     Allergies  Allergen Reactions  . Butorphanol Tartrate Other (See Comments)    REACTION: Heart racing to the point that medical staff thought she would have a heart attack.  . Versed [Midazolam] Other (See Comments)    Reaction: Heart racing to the point that medical staff thought she would have a  heart attack.  . Amoxicillin-Pot Clavulanate Diarrhea, Nausea And Vomiting and Other (See Comments)    REACTION: gi upset  . Aspirin Other (See Comments)    REACTION: nose bleeds need medical help to stop  . Codeine Itching  . Erythromycin Diarrhea, Nausea And Vomiting and Other (See Comments)    REACTION: gi upset  . Latex     REACTION: raw irritation  . Thimerosal Other (See Comments)    REACTION: eye irritation    Social History   Social History  . Marital status: Married    Spouse name: N/A  . Number of children: 3  . Years of education: N/A   Occupational History  . 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 Smoking status: Former Research scientist (life sciences)  . Smokeless tobacco: Never Used  . Alcohol use Yes  . Drug use: No  . Sexual  activity: Yes    Partners: Male   Other Topics Concern  . Not on file   Social History Narrative   Regular exercise- some walking daily      Diet, fruits and veggies    Family History  Problem Relation Age of Onset  . Hyperlipidemia Mother   . Mental illness Mother   . Hyperlipidemia Father   . HIV Father   . Heart attack Maternal Grandmother   . Coronary artery disease Maternal Grandmother   . Cancer Maternal Grandmother     CANCER  . Breast cancer Maternal Grandmother 25  . Hypertension Maternal Grandfather   . Cancer Maternal Grandfather     PANCREATIC  . Diabetes Paternal Grandmother   . Breast cancer Maternal Aunt     The following portions of the patient's history were reviewed and updated as appropriate: allergies, current medications, past family history, past medical history, past social history, past surgical history and problem list.  Review of Systems Review of Systems  Constitutional: Negative.   HENT: Negative.   Respiratory: Negative.   Cardiovascular: Negative.   Gastrointestinal: Negative.   Genitourinary: Negative.   Musculoskeletal: Negative.   Skin: Negative.   Neurological: Negative.   Endo/Heme/Allergies: Negative.   Psychiatric/Behavioral: Negative.      Objective:   BP 130/72   Pulse 85   Ht 5' 0.5" (1.537 m)   Wt 122 lb 9.6 oz (55.6 kg)   BMI 23.55 kg/m  Physical Exam  Constitutional: She is oriented to person, place, and time. She appears well-developed and well-nourished.  HENT:  Head: Normocephalic and atraumatic.  Eyes: Conjunctivae and EOM are normal. No scleral icterus.  Neck: Normal range of motion. Neck supple. No thyromegaly present.  Cardiovascular: Normal rate and regular rhythm.   No murmur heard. Pulmonary/Chest: Effort normal and breath sounds normal.  Abdominal: Soft. Bowel sounds are normal. She exhibits no distension. There is no tenderness. No hernia.  Genitourinary:  Genitourinary Comments: External  genitalia-normal BUS-normal Vagina-there estrogen effect Cervix-no lesions; no cervical motion tenderness Uterus-midplane, normal size and shape, mobile, nontender Adnexa-nonpalpable and nontender Rectovaginal-normal external exam; normal sphincter tone; no rectal masses Bladder-nontender  Musculoskeletal: Normal range of motion.  Lymphadenopathy:    She has no cervical adenopathy.  Neurological: She is alert and oriented to person, place, and time.  Skin: Skin is dry.  Psychiatric: She has a normal mood and affect. Her behavior is normal.    Assessment:   Annual gynecologic examination 45 y.o. Contraception: tubal ligation Normal BMI Patient Active Problem List   Diagnosis Date Noted  . Family  history of breast cancer 07/17/2016  . Vaginal dryness, menopausal 07/17/2016  . Menopause 07/17/2016  . Status post tubal ligation 07/17/2016  . Status post endometrial ablation 07/17/2016  . Menstrual migraine 02/11/2011  . Eye pain 02/05/2011  . Blurred vision 02/05/2011  . TRANSAMINASES, SERUM, ELEVATED 03/13/2010  . DEPRESSION/ANXIETY 01/09/2010  . ALLERGIC RHINITIS 02/10/2009  . ENDOMETRIOSIS 02/10/2009  . PAIN IN JOINT, MULTIPLE SITES 02/10/2009  . 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 Routine preventative health maintenance measures emphasized: Diet/Weight control, Tobacco Cessation and Alcohol/Drug use Vaginal lubricants when necessary. Consider Jo H2O lubricant Return to Show Low, MD   Note: This dictation was prepared with Dragon dictation along with smaller phrase technology. Any transcriptional errors that result from this process are unintentional.

## 2016-07-17 NOTE — Patient Instructions (Addendum)
1. No Pap smear today. Next Pap smear is due in 2019 2. Mammogram is ordered 3. Continue with healthy eating and exercise 4. Recommend calcium 600 mg twice a day and vitamin D 400 units twice a day. 5. Vaginal lubricants as needed. JO H20 Lubricant is suggested. 6. Return in 1 year 7. Return for screening labs. Results will be made available.  Menopause is a normal process in which your reproductive ability comes to an end. This process happens gradually over a span of months to years, usually between the ages of 37 and 50. Menopause is complete when you have missed 12 consecutive menstrual periods. It is important to talk with your health care provider about some of the most common conditions that affect postmenopausal women, such as heart disease, cancer, and bone loss (osteoporosis). Adopting a healthy lifestyle and getting preventive care can help to promote your health and wellness. Those actions can also lower your chances of developing some of these common conditions. WHAT SHOULD I KNOW ABOUT MENOPAUSE? During menopause, you may experience a number of symptoms, such as:  Moderate-to-severe hot flashes.  Night sweats.  Decrease in sex drive.  Mood swings.  Headaches.  Tiredness.  Irritability.  Memory problems.  Insomnia. Choosing to treat or not to treat menopausal changes is an individual decision that you make with your health care provider. WHAT SHOULD I KNOW ABOUT HORMONE REPLACEMENT THERAPY AND SUPPLEMENTS? Hormone therapy products are effective for treating symptoms that are associated with menopause, such as hot flashes and night sweats. Hormone replacement carries certain risks, especially as you become older. If you are thinking about using estrogen or estrogen with progestin treatments, discuss the benefits and risks with your health care provider. WHAT SHOULD I KNOW ABOUT HEART DISEASE AND STROKE? Heart disease, heart attack, and stroke become more likely as you  age. This may be due, in part, to the hormonal changes that your body experiences during menopause. These can affect how your body processes dietary fats, triglycerides, and cholesterol. Heart attack and stroke are both medical emergencies. There are many things that you can do to help prevent heart disease and stroke:  Have your blood pressure checked at least every 1-2 years. High blood pressure causes heart disease and increases the risk of stroke.  If you are 60-40 years old, ask your health care provider if you should take aspirin to prevent a heart attack or a stroke.  Do not use any tobacco products, including cigarettes, chewing tobacco, or electronic cigarettes. If you need help quitting, ask your health care provider.  It is important to eat a healthy diet and maintain a healthy weight.  Be sure to include plenty of vegetables, fruits, low-fat dairy products, and lean protein.  Avoid eating foods that are high in solid fats, added sugars, or salt (sodium).  Get regular exercise. This is one of the most important things that you can do for your health.  Try to exercise for at least 150 minutes each week. The type of exercise that you do should increase your heart rate and make you sweat. This is known as moderate-intensity exercise.  Try to do strengthening exercises at least twice each week. Do these in addition to the moderate-intensity exercise.  Know your numbers.Ask your health care provider to check your cholesterol and your blood glucose. Continue to have your blood tested as directed by your health care provider. WHAT SHOULD I KNOW ABOUT CANCER SCREENING? There are several types of cancer. Take the following  steps to reduce your risk and to catch any cancer development as early as possible. Breast Cancer  Practice breast self-awareness.  This means understanding how your breasts normally appear and feel.  It also means doing regular breast self-exams. Let your health  care provider know about any changes, no matter how small.  If you are 42 or older, have a clinician do a breast exam (clinical breast exam or CBE) every year. Depending on your age, family history, and medical history, it may be recommended that you also have a yearly breast X-ray (mammogram).  If you have a family history of breast cancer, talk with your health care provider about genetic screening.  If you are at high risk for breast cancer, talk with your health care provider about having an MRI and a mammogram every year.  Breast cancer (BRCA) gene test is recommended for women who have family members with BRCA-related cancers. Results of the assessment will determine the need for genetic counseling and BRCA1 and for BRCA2 testing. BRCA-related cancers include these types:  Breast. This occurs in males or females.  Ovarian.  Tubal. This may also be called fallopian tube cancer.  Cancer of the abdominal or pelvic lining (peritoneal cancer).  Prostate.  Pancreatic. Cervical, Uterine, and Ovarian Cancer Your health care provider may recommend that you be screened regularly for cancer of the pelvic organs. These include your ovaries, uterus, and vagina. This screening involves a pelvic exam, which includes checking for microscopic changes to the surface of your cervix (Pap test).  For women ages 21-65, health care providers may recommend a pelvic exam and a Pap test every three years. For women ages 64-65, they may recommend the Pap test and pelvic exam, combined with testing for human papilloma virus (HPV), every five years. Some types of HPV increase your risk of cervical cancer. Testing for HPV may also be done on women of any age who have unclear Pap test results.  Other health care providers may not recommend any screening for nonpregnant women who are considered low risk for pelvic cancer and have no symptoms. Ask your health care provider if a screening pelvic exam is right for  you.  If you have had past treatment for cervical cancer or a condition that could lead to cancer, you need Pap tests and screening for cancer for at least 20 years after your treatment. If Pap tests have been discontinued for you, your risk factors (such as having a new sexual partner) need to be reassessed to determine if you should start having screenings again. Some women have medical problems that increase the chance of getting cervical cancer. In these cases, your health care provider may recommend that you have screening and Pap tests more often.  If you have a family history of uterine cancer or ovarian cancer, talk with your health care provider about genetic screening.  If you have vaginal bleeding after reaching menopause, tell your health care provider.  There are currently no reliable tests available to screen for ovarian cancer. Lung Cancer Lung cancer screening is recommended for adults 12-39 years old who are at high risk for lung cancer because of a history of smoking. A yearly low-dose CT scan of the lungs is recommended if you:  Currently smoke.  Have a history of at least 30 pack-years of smoking and you currently smoke or have quit within the past 15 years. A pack-year is smoking an average of one pack of cigarettes per day for one year.  Yearly screening should:  Continue until it has been 15 years since you quit.  Stop if you develop a health problem that would prevent you from having lung cancer treatment. Colorectal Cancer  This type of cancer can be detected and can often be prevented.  Routine colorectal cancer screening usually begins at age 28 and continues through age 63.  If you have risk factors for colon cancer, your health care provider may recommend that you be screened at an earlier age.  If you have a family history of colorectal cancer, talk with your health care provider about genetic screening.  Your health care provider may also recommend using  home test kits to check for hidden blood in your stool.  A small camera at the end of a tube can be used to examine your colon directly (sigmoidoscopy or colonoscopy). This is done to check for the earliest forms of colorectal cancer.  Direct examination of the colon should be repeated every 5-10 years until age 37. However, if early forms of precancerous polyps or small growths are found or if you have a family history or genetic risk for colorectal cancer, you may need to be screened more often. Skin Cancer  Check your skin from head to toe regularly.  Monitor any moles. Be sure to tell your health care provider:  About any new moles or changes in moles, especially if there is a change in a mole's shape or color.  If you have a mole that is larger than the size of a pencil eraser.  If any of your family members has a history of skin cancer, especially at a young age, talk with your health care provider about genetic screening.  Always use sunscreen. Apply sunscreen liberally and repeatedly throughout the day.  Whenever you are outside, protect yourself by wearing long sleeves, pants, a wide-brimmed hat, and sunglasses. WHAT SHOULD I KNOW ABOUT OSTEOPOROSIS? Osteoporosis is a condition in which bone destruction happens more quickly than new bone creation. After menopause, you may be at an increased risk for osteoporosis. To help prevent osteoporosis or the bone fractures that can happen because of osteoporosis, the following is recommended:  If you are 28-76 years old, get at least 1,000 mg of calcium and at least 600 mg of vitamin D per day.  If you are older than age 53 but younger than age 38, get at least 1,200 mg of calcium and at least 600 mg of vitamin D per day.  If you are older than age 20, get at least 1,200 mg of calcium and at least 800 mg of vitamin D per day. Smoking and excessive alcohol intake increase the risk of osteoporosis. Eat foods that are rich in calcium and  vitamin D, and do weight-bearing exercises several times each week as directed by your health care provider. WHAT SHOULD I KNOW ABOUT HOW MENOPAUSE AFFECTS [place]? Depression may occur at any age, but it is more common as you become older. Common symptoms of depression include:  Low or sad mood.  Changes in sleep patterns.  Changes in appetite or eating patterns.  Feeling an overall lack of motivation or enjoyment of activities that you previously enjoyed.  Frequent crying spells. Talk with your health care provider if you think that you are experiencing depression. WHAT SHOULD I KNOW ABOUT IMMUNIZATIONS? It is important that you get and maintain your immunizations. These include:  Tetanus, diphtheria, and pertussis (Tdap) booster vaccine.  Influenza every year before the flu  season begins.  Pneumonia vaccine.  Shingles vaccine. Your health care provider may also recommend other immunizations.   This information is not intended to replace advice given to you by your health care provider. Make sure you discuss any questions you have with your health care provider.   Document Released: 10/18/2005 Document Revised: 09/16/2014 Document Reviewed: 04/28/2014 Elsevier Interactive Patient Education [employer].

## 2016-07-19 ENCOUNTER — Other Ambulatory Visit: Payer: BLUE CROSS/BLUE SHIELD

## 2016-08-16 ENCOUNTER — Encounter (INDEPENDENT_AMBULATORY_CARE_PROVIDER_SITE_OTHER): Payer: Self-pay

## 2016-08-16 ENCOUNTER — Ambulatory Visit (INDEPENDENT_AMBULATORY_CARE_PROVIDER_SITE_OTHER): Payer: BLUE CROSS/BLUE SHIELD | Admitting: Neurology

## 2016-08-16 DIAGNOSIS — R202 Paresthesia of skin: Secondary | ICD-10-CM

## 2016-08-16 DIAGNOSIS — Z0289 Encounter for other administrative examinations: Secondary | ICD-10-CM

## 2016-08-16 NOTE — Procedures (Signed)
Full Name: Caitlin Gallagher Gender: Female MRN #: [id number] Date of Birth: December 01, 1970    Visit Date: 08/16/2016 10:55 Age: 45 Years 16 Months Old Examining Physician: Marcial Pacas, MD  Referring Physician: Dr. French Ana History: 45 year old right-handed female, presented with one year history of right elbow pain, radiating discomfort to right ulnar forearm, involving right fourth and fifth fingers.  Summary of the test: Nerve conduction study: Bilateral median, ulnar sensory responses were within normal limit and symmetric. Left ulnar, right median motor responses were normal. Right ulnar motor responses showed mildly prolonged distal latency, with normal C map amplitude, there was 15 m/s velocity job across right elbow.  Electromyography: Selective needle examinations were performed at right upper extremity muscles and right cervical paraspinal muscles.  There was no significant abnormality noticed.     Conclusion: This is a mild abnormal study. 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 There was no evidence of right cervical radiculopathy.     ------------------------------- Marcial Pacas, M.D.  [facility] [place], [place] [address] Tel: [phone] Fax: [phone]        [facility]    Nerve / Sites Rec. Site Peak Lat Ref.  Amp Ref. Segments Distance    ms ms V V  cm  R Median - Orthodromic (Dig II, Mid palm)     Dig II Wrist 2.8 ?3.4 24 ?10 Dig II - Wrist 13  R Ulnar - Orthodromic, (Dig V, Mid palm)     Dig V Wrist 2.7 ?3.1 12 ?5 Dig V - Wrist 11     Mid palm Wrist 2.1 ?2.3 16 ?11 Mid palm - Wrist 8  L Ulnar - Orthodromic, (Dig V, Mid palm)     Dig V Wrist 2.6 ?3.1 13 ?5 Dig V - Wrist 11     Mid palm Wrist 1.9 ?2.3 64 ?11 Mid palm - Wrist 8  L Median - Orthodromic (Dig II, Mid palm)     Dig II Wrist 2.7 ?3.4 27 ?10 Dig II - Wrist 13        [facility]    Nerve / Sites Muscle Latency Ref. Amplitude Ref. Rel Amp Segments Distance Lat Diff Velocity Ref. Area    ms ms mV mV %  cm ms m/s m/s mVms  R Median - APB     Wrist APB 3.0 ?4.4 5.8 ?4.0 100 Wrist - APB 7    23.8     Upper arm APB 6.3  5.2  89.3 Upper arm - Wrist 20 3.3 60  21.0  R Ulnar - ADM     Wrist ADM 3.4 ?3.3 10.4 ?6.0 100 Wrist - ADM 7    34.6     B.Elbow ADM 5.6  10.2  97.8 B.Elbow - Wrist 14 2.2 64 ?49 34.8     A.Elbow ADM 7.8  9.7  94.8 A.Elbow - B.Elbow 9 2.2 41 ?49 32.5         A.Elbow - Wrist  4.4     L Ulnar - ADM     Wrist ADM 3.0 ?3.3 9.5 ?6.0 100 Wrist - ADM 7    38.7     B.Elbow ADM 5.1  8.8  93.1 B.Elbow - Wrist 14 2.1 67 ?49 37.2     A.Elbow ADM 6.8  8.1  91.6 A.Elbow - B.Elbow 11 1.7 64 ?49 34.3  A.Elbow - Wrist  3.8              F  Wave    Nerve F Lat Ref.   ms ms  R Ulnar - ADM 25.3 ?32.0  L Ulnar - ADM 23.3 ?32.0         EMG full       EMG Summary Table    Spontaneous MUAP Recruitment  Muscle IA Fib PSW Fasc Other Amp Dur. Poly Pattern  R. Pronator teres Normal None None None _______ Normal Normal Normal Normal  R. Flexor carpi ulnaris Normal None None None _______ Normal Normal Normal Normal  R. First dorsal interosseous Normal None None None _______ Normal Normal Normal Normal  R. Extensor digitorum communis Normal None None None _______ Normal Normal Normal Normal  R. Biceps brachii Normal None None None _______ Normal Normal Normal Normal  R. Deltoid Normal None None None _______ Normal Normal Normal Normal  R. Triceps brachii Normal None None None _______ Normal Normal Normal Normal  R. Cervical paraspinals Normal None None None _______ Normal Normal Normal Normal

## 2017-04-23 ENCOUNTER — Other Ambulatory Visit: Payer: Self-pay | Admitting: Student

## 2017-04-23 DIAGNOSIS — Z1239 Encounter for other screening for malignant neoplasm of breast: Secondary | ICD-10-CM

## 2017-05-28 ENCOUNTER — Ambulatory Visit
Admission: RE | Admit: 2017-05-28 | Discharge: 2017-05-28 | Disposition: A | Discharge status: Home / Self Care | Payer: BLUE CROSS/BLUE SHIELD | Source: Ambulatory Visit | Attending: Student | Admitting: Student

## 2017-05-28 DIAGNOSIS — Z1239 Encounter for other screening for malignant neoplasm of breast: Secondary | ICD-10-CM

## 2017-05-28 DIAGNOSIS — Z1231 Encounter for screening mammogram for malignant neoplasm of breast: Secondary | ICD-10-CM | POA: Diagnosis not present

## 2017-07-03 DIAGNOSIS — M7710 Lateral epicondylitis, unspecified elbow: Secondary | ICD-10-CM | POA: Insufficient documentation

## 2017-07-23 ENCOUNTER — Encounter: Payer: BLUE CROSS/BLUE SHIELD | Admitting: Obstetrics and Gynecology

## 2019-08-15 ENCOUNTER — Encounter: Payer: Self-pay | Admitting: Intensive Care

## 2019-08-15 ENCOUNTER — Emergency Department
Admission: EM | Admit: 2019-08-15 | Discharge: 2019-08-15 | Disposition: A | Discharge status: Home / Self Care | Payer: PRIVATE HEALTH INSURANCE | Attending: Emergency Medicine | Admitting: Emergency Medicine

## 2019-08-15 ENCOUNTER — Emergency Department: Payer: PRIVATE HEALTH INSURANCE

## 2019-08-15 ENCOUNTER — Other Ambulatory Visit: Payer: Self-pay

## 2019-08-15 DIAGNOSIS — Z87891 Personal history of nicotine dependence: Secondary | ICD-10-CM | POA: Insufficient documentation

## 2019-08-15 DIAGNOSIS — Z8541 Personal history of malignant neoplasm of cervix uteri: Secondary | ICD-10-CM | POA: Diagnosis not present

## 2019-08-15 DIAGNOSIS — Z79899 Other long term (current) drug therapy: Secondary | ICD-10-CM | POA: Diagnosis not present

## 2019-08-15 DIAGNOSIS — R079 Chest pain, unspecified: Secondary | ICD-10-CM

## 2019-08-15 DIAGNOSIS — U071 COVID-19: Secondary | ICD-10-CM | POA: Diagnosis not present

## 2019-08-15 DIAGNOSIS — R0789 Other chest pain: Secondary | ICD-10-CM | POA: Diagnosis present

## 2019-08-15 LAB — CBC
HCT: 42.3 % (ref 36.0–46.0)
Hemoglobin: 14.2 g/dL (ref 12.0–15.0)
MCH: 30.1 pg (ref 26.0–34.0)
MCHC: 33.6 g/dL (ref 30.0–36.0)
MCV: 89.8 fL (ref 80.0–100.0)
Platelets: 230 10*3/uL (ref 150–400)
RBC: 4.71 MIL/uL (ref 3.87–5.11)
RDW: 11.9 % (ref 11.5–15.5)
WBC: 3.2 10*3/uL — ABNORMAL LOW (ref 4.0–10.5)
nRBC: 0 % (ref 0.0–0.2)

## 2019-08-15 LAB — BASIC METABOLIC PANEL
Anion gap: 11 (ref 5–15)
BUN: 18 mg/dL (ref 6–20)
CO2: 24 mmol/L (ref 22–32)
Calcium: 9.2 mg/dL (ref 8.9–10.3)
Chloride: 106 mmol/L (ref 98–111)
Creatinine, Ser: 0.62 mg/dL (ref 0.44–1.00)
GFR calc Af Amer: >60 mL/min (ref >60)
GFR calc non Af Amer: >60 mL/min (ref >60)
Glucose, Bld: 106 mg/dL — ABNORMAL HIGH (ref 70–99)
Potassium: 3.4 mmol/L — ABNORMAL LOW (ref 3.5–5.1)
Sodium: 141 mmol/L (ref 135–145)

## 2019-08-15 LAB — TROPONIN I (HIGH SENSITIVITY)
Troponin I (High Sensitivity): 2 ng/L (ref <18)
Troponin I (High Sensitivity): 3 ng/L (ref <18)

## 2019-08-15 MED ORDER — ALUM & MAG HYDROXIDE-SIMETH 200-200-20 MG/5ML PO SUSP
15.0000 mL | Freq: Once | ORAL | Status: AC
  Administered 2019-08-15: 15 mL via ORAL
  Filled 2019-08-15: qty 30

## 2019-08-15 MED ORDER — LIDOCAINE VISCOUS HCL 2 % MT SOLN
15.0000 mL | Freq: Once | OROMUCOSAL | Status: AC
  Administered 2019-08-15: 15 mL via ORAL
  Filled 2019-08-15: qty 15

## 2019-08-15 NOTE — ED Provider Notes (Signed)
[facility] Emergency Department Provider Note   ____________________________________________   First MD Initiated Contact with Patient 08/15/19 1043     (approximate)  I have reviewed the triage vital signs and the nursing notes.   HISTORY  Chief Complaint Chest Pain    HPI Caitlin Gallagher is a 48 y.o. female with past medical history of COPD who presents to the ED complaining of chest pain.  Patient reports that she has been dealing with nausea for the past couple of days and she was woken from sleep around 6:00 this morning with a squeezing pain in the center of her chest.  Pain has seemed to come and go since then, becoming more severe for a minute or 2 at a time before improving.  She denies any associated fevers, cough, or shortness of breath.  She has not noticed any recent pain or swelling in her lower extremities, denies any recent surgery or chemotherapy.  She has never had similar symptoms in the past.        Past Medical History:  Diagnosis Date  . Allergy   . Cancer ([place])    cervical  . Complication of anesthesia    hard to wake up    Patient Active Problem List   Diagnosis Date Noted  . Paresthesia 08/16/2016  . Family history of breast cancer 07/17/2016  . Vaginal dryness, menopausal 07/17/2016  . Menopause 07/17/2016  . Status post tubal ligation 07/17/2016  . History of loop electrical excision procedure (LEEP) 07/17/2016  . Menstrual migraine 02/11/2011  . Eye pain 02/05/2011  . Blurred vision 02/05/2011  . TRANSAMINASES, SERUM, ELEVATED 03/13/2010  . DEPRESSION/ANXIETY 01/09/2010  . ALLERGIC RHINITIS 02/10/2009  . ENDOMETRIOSIS 02/10/2009  . PAIN IN JOINT, MULTIPLE SITES 02/10/2009  . 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 CESAREAN SECTION    . GANGLION CYST EXCISION Right 08/16/2015   Procedure: REMOVAL GANGLION OF WRIST;  Surgeon: Corky Mull, MD;  Location:  [place];  Service: Orthopedics;  Laterality: Right;  . ganglion cyst removed from rt. wrist    . GROIN CYST    . LEEP    . MISCARRIGE    . TUBAL LIGATION      Prior to Admission medications   Medication Sig Start Date End Date Taking? Authorizing Provider  Multiple Vitamins-Calcium (DAILY VITAMINS FOR WOMEN) TABS Take 2 tablets by mouth 2 (two) times daily.      [provider]  Probiotic Product (SOLUBLE FIBER/PROBIOTICS PO) Take by mouth daily.      [provider]    Allergies Butorphanol tartrate, Versed [midazolam], Amoxicillin-pot clavulanate, Aspirin, Codeine, Erythromycin, Latex, and Thimerosal  Family History  Problem Relation Age of Onset  . Hyperlipidemia Mother   . Mental illness Mother   . Hyperlipidemia Father   . HIV Father   . Heart attack Maternal Grandmother   . Coronary artery disease Maternal Grandmother   . Cancer Maternal Grandmother        CANCER  . Breast cancer Maternal Grandmother 29  . Hypertension Maternal Grandfather   . Cancer Maternal Grandfather        PANCREATIC  . Diabetes Paternal Grandmother   . Breast cancer Maternal Aunt     Social History Social History   Tobacco Use  . Smoking status: Former Research scientist (life sciences)  . Smokeless tobacco: Never Used  Substance Use Topics  . Alcohol use: Not Currently  .  Drug use: No    Review of Systems  Constitutional: No fever/chills Eyes: No visual changes. ENT: No sore throat. Cardiovascular: Positive for chest pain. Respiratory: Denies shortness of breath. Gastrointestinal: No abdominal pain.  No nausea, no vomiting.  No diarrhea.  No constipation. Genitourinary: Negative for dysuria. Musculoskeletal: Negative for back pain. Skin: Negative for rash. Neurological: Negative for headaches, focal weakness or numbness.  ____________________________________________   PHYSICAL EXAM:  VITAL SIGNS: ED Triage Vitals  Enc Vitals Group     BP 08/15/19 0949 (!) 129/94      Pulse Rate 08/15/19 0949 99     Resp 08/15/19 0949 18     Temp 08/15/19 0949 98.3 F (36.8 C)     Temp Source 08/15/19 0949 Oral     SpO2 08/15/19 0949 100 %     Weight 08/15/19 0950 140 lb (63.5 kg)     Height 08/15/19 0950 5\' 1"  (1.549 m)     Head Circumference --      Peak Flow --      Pain Score 08/15/19 0953 2     Pain Loc --      Pain Edu? --      Excl. in [place]? --     Constitutional: Alert and oriented. Eyes: Conjunctivae are normal. Head: Atraumatic. Nose: No congestion/rhinnorhea. Mouth/Throat: Mucous membranes are moist. Neck: Normal ROM Cardiovascular: Normal rate, regular rhythm. Grossly normal heart sounds.  No chest wall tenderness. Respiratory: Normal respiratory effort.  No retractions. Lungs CTAB. Gastrointestinal: Soft and nontender. No distention. Genitourinary: deferred Musculoskeletal: No lower extremity tenderness nor edema. Neurologic:  Normal speech and language. No gross focal neurologic deficits are appreciated. Skin:  Skin is warm, dry and intact. No rash noted. Psychiatric: Mood and affect are normal. Speech and behavior are normal.  ____________________________________________   LABS (all labs ordered are listed, but only abnormal results are displayed)  Labs Reviewed  BASIC METABOLIC PANEL - Abnormal; Notable for the following components:      Result Value   Potassium 3.4 (*)    Glucose, Bld 106 (*)    All other components within normal limits  CBC - Abnormal; Notable for the following components:   WBC 3.2 (*)    All other components within normal limits  POC URINE PREG, ED  TROPONIN I (HIGH SENSITIVITY)  TROPONIN I (HIGH SENSITIVITY)   ____________________________________________  EKG  ED ECG REPORT I, Blake Divine, the attending physician, personally viewed and interpreted this ECG.   Date: 08/15/2019  EKG Time: 9:45  Rate: 77  Rhythm: normal sinus rhythm  Axis: Normal  Intervals:right bundle branch block  ST&T Change:  RBBB similar to prior, no ST/T changes   PROCEDURES  Procedure(s) performed (including Critical Care):  Procedures   ____________________________________________   INITIAL IMPRESSION / ASSESSMENT AND PLAN / ED COURSE       48 year old female with history of COPD presents to the ED complaining of acute onset of central chest "squeezing" pain that woke her from sleep around 6:00 this morning.  Initial EKG shows no acute ischemic changes, does show right bundle branch block that is similar to prior.  Low suspicion for ACS given atypical symptoms and patient with a heart score of less than 4.  Initial troponin is negative, if repeat also within normal limits, this will essentially rule out ACS.  Doubt PE as patient is PERC negative and do not suspect dissection.  Chest x-ray is pending but no infectious symptoms to suggest COVID-19 or pneumonia.  Given recent nausea, it is possible her symptoms are related to reflux, will trial GI cocktail.  Patient reports pain has resolved following GI cocktail and repeat troponin is within normal limits.  Suspect GERD as etiology of her symptoms, counseled patient to follow-up with her PCP and to return to the ED for new or worsening symptoms.  Patient agrees with plan.      ____________________________________________   FINAL CLINICAL IMPRESSION(S) / ED DIAGNOSES  Final diagnoses:  Nonspecific chest pain     ED Discharge Orders    None       Note:  This document was prepared using Dragon voice recognition software and may include unintentional dictation errors.   Blake Divine, MD 08/15/19 1330

## 2019-08-15 NOTE — ED Triage Notes (Signed)
C/o squeezing/tight chest pains since this AM intermittently with nausea

## 2019-08-15 NOTE — ED Notes (Signed)
First Nurse Note: Pt to ED c/o nausea x 3 days. Woke up this morning with squeezing in her chest. Pain comes and goes. Pt is in NAD

## 2019-08-16 ENCOUNTER — Telehealth: Payer: Self-pay | Admitting: Emergency Medicine

## 2019-08-16 LAB — NOVEL CORONAVIRUS, NAA (HOSP ORDER, SEND-OUT TO REF LAB; TAT 18-24 HRS): SARS-CoV-2, NAA: DETECTED — AB

## 2019-08-16 NOTE — Telephone Encounter (Signed)
Called patient to assure she is aware of positive covid 19 result.  She is aware .  Discussed isolation and quarantine guidelines.

## 2019-10-25 ENCOUNTER — Other Ambulatory Visit: Payer: Self-pay | Admitting: Sports Medicine

## 2019-10-25 DIAGNOSIS — G8929 Other chronic pain: Secondary | ICD-10-CM

## 2019-10-25 DIAGNOSIS — M76891 Other specified enthesopathies of right lower limb, excluding foot: Secondary | ICD-10-CM

## 2019-10-25 DIAGNOSIS — M25861 Other specified joint disorders, right knee: Secondary | ICD-10-CM

## 2019-11-08 ENCOUNTER — Ambulatory Visit
Admission: RE | Admit: 2019-11-08 | Discharge: 2019-11-08 | Disposition: A | Discharge status: Home / Self Care | Payer: 59 | Source: Ambulatory Visit | Attending: Sports Medicine | Admitting: Sports Medicine

## 2019-11-08 ENCOUNTER — Other Ambulatory Visit: Payer: Self-pay

## 2019-11-08 DIAGNOSIS — M25561 Pain in right knee: Secondary | ICD-10-CM | POA: Insufficient documentation

## 2019-11-08 DIAGNOSIS — G8929 Other chronic pain: Secondary | ICD-10-CM | POA: Insufficient documentation

## 2019-11-08 DIAGNOSIS — M25861 Other specified joint disorders, right knee: Secondary | ICD-10-CM | POA: Diagnosis present

## 2019-11-08 DIAGNOSIS — M76891 Other specified enthesopathies of right lower limb, excluding foot: Secondary | ICD-10-CM | POA: Diagnosis present

## 2019-11-18 ENCOUNTER — Ambulatory Visit: Payer: 59 | Attending: Internal Medicine

## 2019-11-18 ENCOUNTER — Other Ambulatory Visit: Payer: Self-pay

## 2019-11-18 DIAGNOSIS — Z23 Encounter for immunization: Secondary | ICD-10-CM

## 2019-11-18 NOTE — Progress Notes (Signed)
   Covid-19 Vaccination Clinic  Name:  Caitlin Gallagher    MRN: [id number] DOB: February 12, 1971  11/18/2019  [name] was observed post Covid-19 immunization for 15 minutes without incident. She was provided with Vaccine Information Sheet and instruction to access the V-Safe system.   [name] was instructed to call 911 with any severe reactions post vaccine: Marland Kitchen Difficulty breathing  . Swelling of face and throat  . A fast heartbeat  . A bad rash all over body  . Dizziness and weakness   Immunizations Administered    Name Date Dose VIS Date Route   Pfizer COVID-19 Vaccine 11/18/2019 10:23 AM 0.3 mL 08/20/2019 Intramuscular   Manufacturer: [place]   Lot: [id number]   [place]: [id number]

## 2019-12-14 ENCOUNTER — Ambulatory Visit: Payer: 59 | Attending: Internal Medicine

## 2019-12-14 DIAGNOSIS — Z23 Encounter for immunization: Secondary | ICD-10-CM

## 2019-12-14 NOTE — Progress Notes (Signed)
   Covid-19 Vaccination Clinic  Name:  Caitlin Gallagher    MRN: [id number] DOB: Jan 13, 1971  12/14/2019  Caitlin Gallagher was observed post Covid-19 immunization for 15 minutes without incident. She was provided with Vaccine Information Sheet and instruction to access the V-Safe system.   Caitlin Gallagher was instructed to call 911 with any severe reactions post vaccine: Marland Kitchen Difficulty breathing  . Swelling of face and throat  . A fast heartbeat  . A bad rash all over body  . Dizziness and weakness   Immunizations Administered    Name Date Dose VIS Date Route   Pfizer COVID-19 Vaccine 12/14/2019 10:30 AM 0.3 mL 08/20/2019 Intramuscular   Manufacturer: [place]   Lot: [id number]   [place]: [id number]

## 2020-07-06 DIAGNOSIS — N938 Other specified abnormal uterine and vaginal bleeding: Secondary | ICD-10-CM | POA: Insufficient documentation

## 2020-07-07 ENCOUNTER — Ambulatory Visit: Payer: 59 | Admitting: Podiatry

## 2020-07-07 ENCOUNTER — Other Ambulatory Visit: Payer: Self-pay

## 2020-12-26 IMAGING — MR MR KNEE*R* W/O CM
6 series · 40 of 40 positions shown · non-contrast
Comparison: MRI right knee dated December 08, 2015.

CLINICAL DATA: Chronic right knee pain.

EXAM:
MRI OF THE RIGHT KNEE WITHOUT CONTRAST
TECHNIQUE: Multiplanar, multisequence MR imaging of the knee was performed. No
intravenous contrast was administered.

[Series 8: T2 fat-sat · axial · right · 4.0mm · 0.50mm/px · z∈[-41,+83]mm · 5 of 26 slices shown (1 of 3)]
[im 1/26]
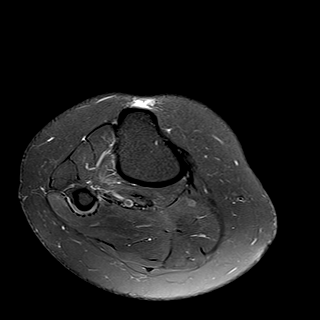
[im 7/26]
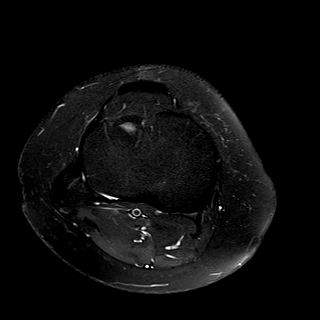
[im 13/26]
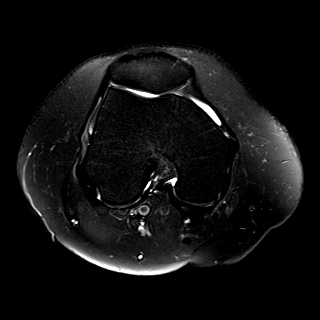
[im 19/26]
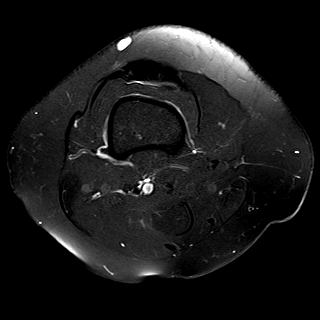
[im 26/26]
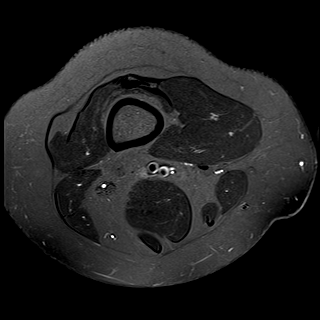

[Series 9: T1 · coronal · right · 4.0mm · 0.42mm/px · 7 of 32 slices shown]
[im 1/32]
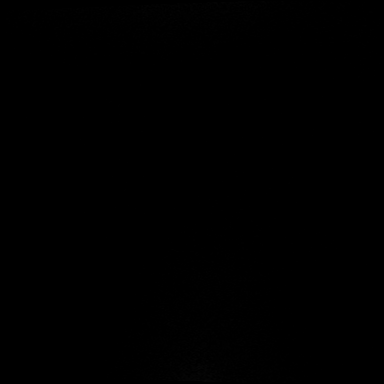
[im 6/32]
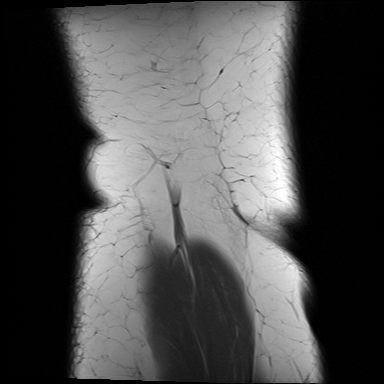
[im 11/32]
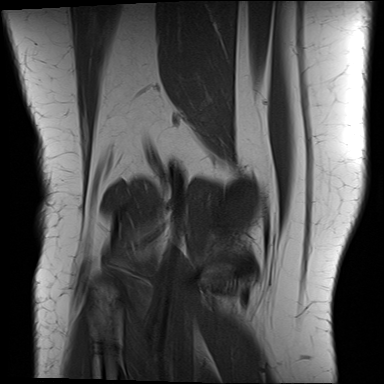
[im 16/32]
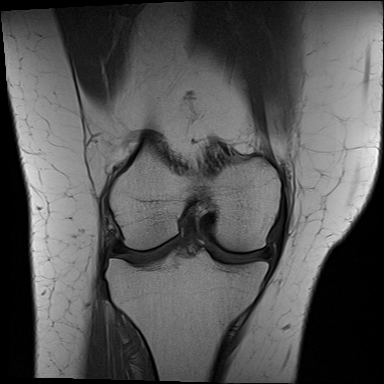
[im 21/32]
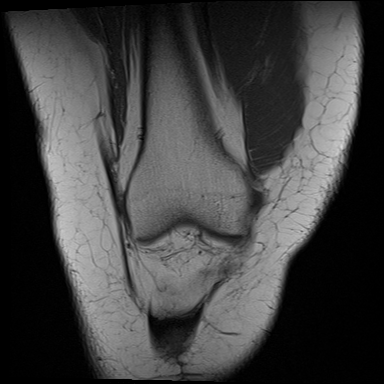
[im 26/32]
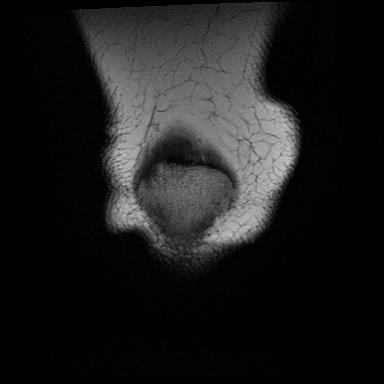
[im 32/32]
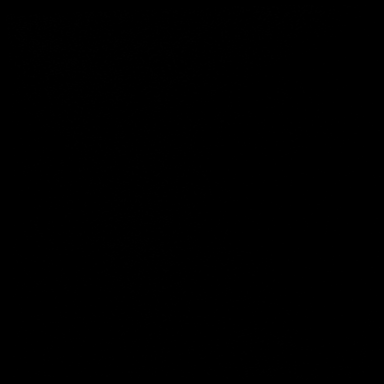

[Series 10: T2 fat-sat · coronal · right · 4.0mm · 0.59mm/px · 7 of 32 slices shown (2 of 3)]
[im 1/32]
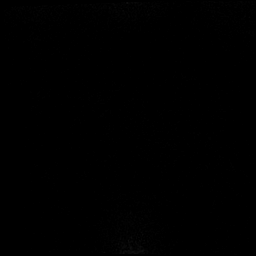
[im 6/32]
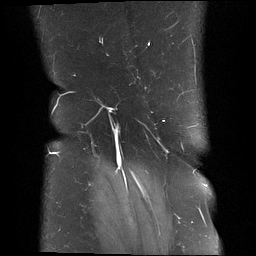
[im 11/32]
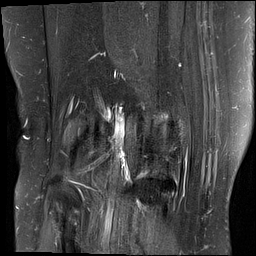
[im 16/32]
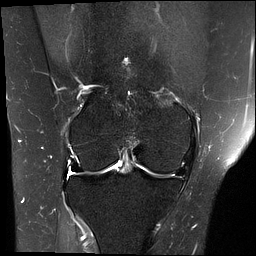
[im 21/32]
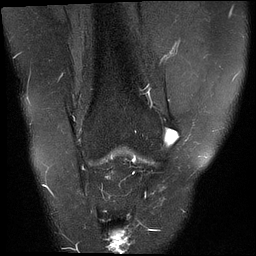
[im 26/32]
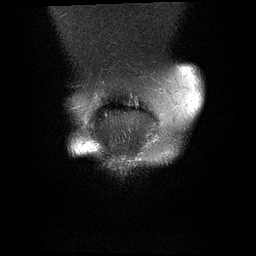
[im 32/32]
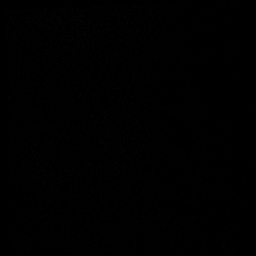

[Series 11: PD fat-sat · sagittal · right · 3.0mm · 0.59mm/px · 7 of 36 slices shown (1 of 2)]
[im 1/36]
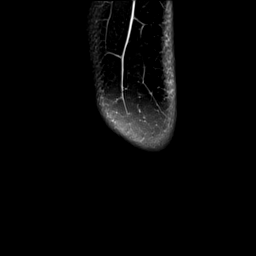
[im 6/36]
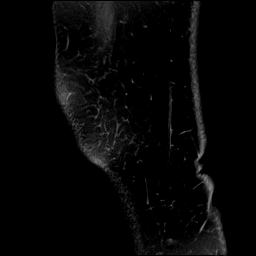
[im 12/36]
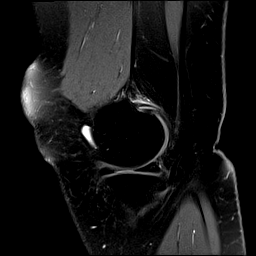
[im 18/36]
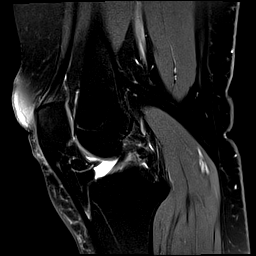
[im 24/36]
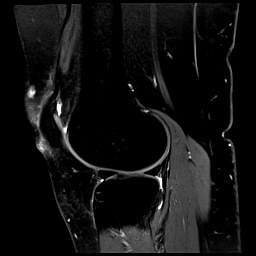
[im 30/36]
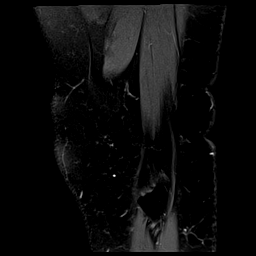
[im 36/36]
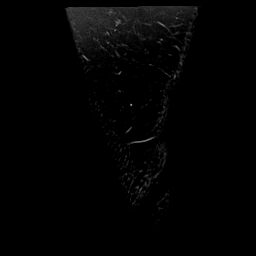

[Series 12: PD fat-sat · coronal · right · 4.0mm · 0.59mm/px · 7 of 32 slices shown (2 of 2)]
[im 1/32]
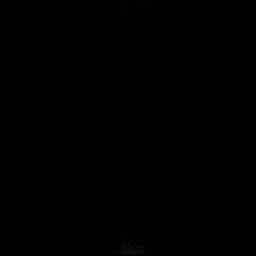
[im 6/32]
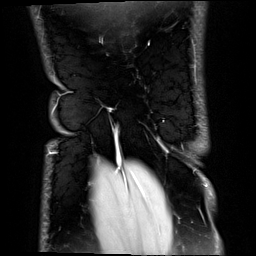
[im 11/32]
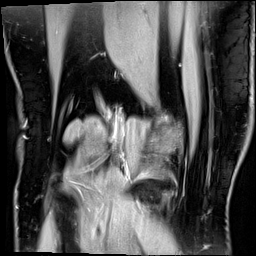
[im 16/32]
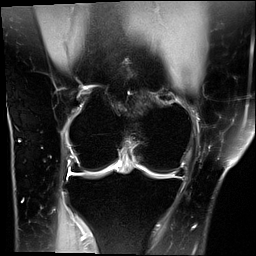
[im 21/32]
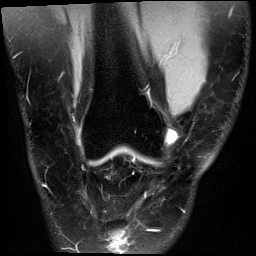
[im 26/32]
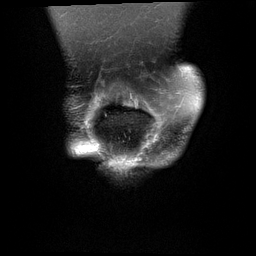
[im 32/32]
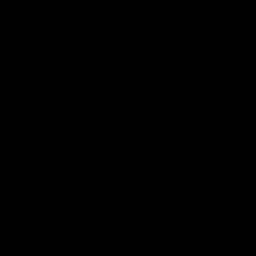

[Series 13: T2 fat-sat · sagittal · right · 3.0mm · 0.59mm/px · 7 of 36 slices shown (3 of 3)]
[im 1/36]
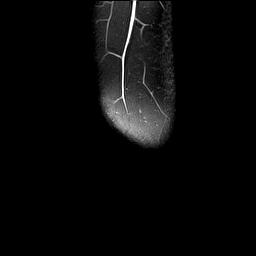
[im 6/36]
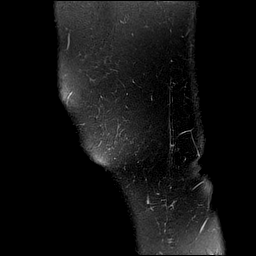
[im 12/36]
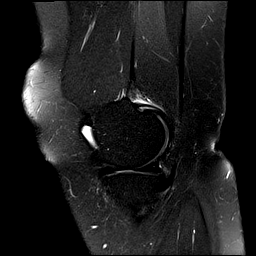
[im 18/36]
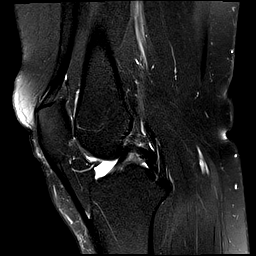
[im 24/36]
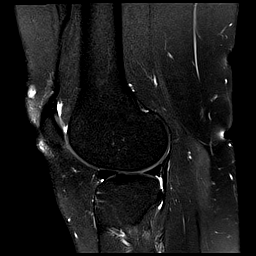
[im 30/36]
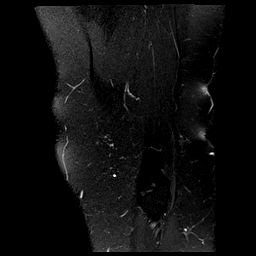
[im 36/36]
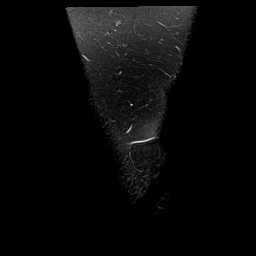

[40 of 40 positions shown; findings below may reference images not displayed]

FINDINGS: MENISCI

Medial meniscus:  Intact.

Lateral meniscus:  Intact.

LIGAMENTS

Cruciates:  Intact ACL and PCL.

Collaterals: Medial collateral ligament is intact. Lateral
collateral ligament complex is intact.

CARTILAGE

Patellofemoral:  No chondral defect.

Medial:  Mild thinning without focal defect.

Lateral:  Mild thinning without focal defect.

Joint: No significant joint effusion. Normal Hoffa's fat.
Progressive mild thickening of the medial plica.

Popliteal Fossa:  No Baker cyst. Intact popliteus tendon.

Extensor Mechanism: Intact quadriceps tendon and patellar tendon.
Intact medial and lateral patellar retinaculum. Intact MPFL.

Bones: No focal marrow signal abnormality. No fracture or
dislocation.

Other: New small intrasubstance tear of the medial gastrocnemius
tendon origin mild surrounding soft tissue edema.
IMPRESSION: 1. No meniscal or ligamentous injury.
2. New small intrasubstance tear of the medial gastrocnemius tendon
origin.
3. Progressive mild thickening of the medial plica without
underlying medial patellar facet cartilage abnormality.

## 2021-11-30 ENCOUNTER — Other Ambulatory Visit: Payer: Self-pay | Admitting: Internal Medicine

## 2021-11-30 DIAGNOSIS — Z1231 Encounter for screening mammogram for malignant neoplasm of breast: Secondary | ICD-10-CM

## 2022-01-16 ENCOUNTER — Other Ambulatory Visit: Payer: Self-pay | Admitting: Dermatology

## 2022-01-16 ENCOUNTER — Ambulatory Visit: Payer: 59 | Admitting: Dermatology

## 2022-01-16 DIAGNOSIS — L732 Hidradenitis suppurativa: Secondary | ICD-10-CM | POA: Diagnosis not present

## 2022-01-16 DIAGNOSIS — L720 Epidermal cyst: Secondary | ICD-10-CM | POA: Diagnosis not present

## 2022-01-16 MED ORDER — CLINDAMYCIN PHOSPHATE 1 % EX SOLN: CUTANEOUS | 2 refills | Status: AC

## 2022-01-16 MED ORDER — DOXYCYCLINE MONOHYDRATE 100 MG PO CAPS: 100.0000 mg | ORAL_CAPSULE | Freq: Two times a day (BID) | ORAL | 1 refills | Status: DC

## 2022-01-16 NOTE — Patient Instructions (Addendum)
Hidradenitis Suppurativa is a chronic; persistent; non-curable, but treatable condition due to abnormal inflamed sweat glands in the body folds (axilla, inframammary, groin, medial thighs), causing recurrent painful draining cysts and scarring. It can be associated with severe scarring acne and cysts; also abscesses and scarring of scalp. The goal is control and prevention of flares, as it is not curable. Scars are permanent and can be thickened. Treatment may include daily use of topical medication and oral antibiotics.  Oral isotretinoin may also be helpful.  For more severe cases, Humira (a biologic injection) may be prescribed to decrease the inflammatory process and prevent flares.  When indicated, inflamed cysts may also be treated surgically. ?Start  ?Panoxyl cream 4 % wash - apply to affected areas of skin at right groin use like body wash let sit for a few minutes and rinse daily  ?Start Clindamycin 1 % solution - apply topically to affected areas right groin after shower daily ?Start Doxycycline 100 mg capsule by mouth 2 times daily with food and drink for 2 weeks while flared. When improved can take 1 capsule by mouth daily.  ? ?Doxycycline should be taken with food to prevent nausea. Do not lay down for 30 minutes after taking. Be cautious with sun exposure and use good sun protection while on this medication. Pregnant women should not take this medication.  ? ? ? ? ?Pre-Operative Instructions ? ?You are scheduled for a surgical procedure at [facility]. We recommend you read the following instructions. If you have any questions or concerns, please call the office at [phone]. ? ?Shower and wash the entire body with soap and water the day of your surgery paying special attention to cleansing at and around the planned surgery site. ? ?Avoid aspirin or aspirin containing products at least fourteen (14) days prior to your surgical procedure and for at least one week (7 Days) after your surgical  procedure. If you take aspirin on a regular basis for heart disease or history of stroke or for any other reason, we may recommend you continue taking aspirin but please notify us if you take this on a regular basis. Aspirin can cause more bleeding to occur during surgery as well as prolonged bleeding and bruising after surgery.  ? ?Avoid other nonsteroidal pain medications at least one week prior to surgery and at least one week prior to your surgery. These include medications such as Ibuprofen (Motrin, Advil and Nuprin), Naprosyn, Voltaren, Relafen, etc. If medications are used for therapeutic reasons, please inform us as they can cause increased bleeding or prolonged bleeding during and bruising after surgical procedures.  ? ?Please advise [place] if you are taking any "blood thinner" medications such as Coumadin or Dipyridamole or Plavix or similar medications. These cause increased bleeding and prolonged bleeding during procedures and bruising after surgical procedures. We may have to consider discontinuing these medications briefly prior to and shortly after your surgery if safe to do so.  ? ?Please inform us of all medications you are currently taking. All medications that are taken regularly should be taken the day of surgery as you always do. Nevertheless, we need to be informed of what medications you are taking prior to surgery to know whether they will affect the procedure or cause any complications.  ? ?Please inform us of any medication allergies. Also inform us of whether you have allergies to Latex or rubber products or whether you have had any adverse reaction to Lidocaine or Epinephrine. ? ?Please inform us  of any prosthetic or artificial body parts such as artificial heart valve, joint replacements, etc., or similar condition that might require preoperative antibiotics.  ? ?We recommend avoidance of alcohol at least two weeks prior to surgery and continued avoidance for at least two weeks after  surgery.  ? ?We recommend discontinuation of tobacco smoking at least two weeks prior to surgery and continued abstinence for at least two weeks after surgery. ? ?Do not plan strenuous exercise, strenuous work or strenuous lifting for approximately four weeks after your surgery.  ? ?We request if you are unable to make your scheduled surgical appointment, please call us at least a week in advance or as soon as you are aware of a problem so that we can cancel or reschedule the appointment.  ? ?You MAY TAKE TYLENOL (acetaminophen) for pain as it is not a blood thinner.  ? ?PLEASE PLAN TO BE IN TOWN FOR TWO WEEKS FOLLOWING SURGERY, THIS IS IMPORTANT SO YOU CAN BE CHECKED FOR DRESSING CHANGES, SUTURE REMOVAL AND TO MONITOR FOR POSSIBLE COMPLICATIONS.  ? ? ?If You Need Anything After Your Visit ? ?If you have any questions or concerns for your doctor, please call our main line at [phone] and press option 4 to reach your doctor's medical assistant. If no one answers, please leave a voicemail as directed and we will return your call as soon as possible. Messages left after 4 pm will be answered the following business day.  ? ?You may also send [place] a message via MyChart. We typically respond to MyChart messages within 1-2 business days. ? ?For prescription refills, please ask your pharmacy to contact our office. Our fax number is [phone]. ? ?If you have an urgent issue when the clinic is closed that cannot wait until the next business day, you can page your doctor at the number below.   ? ?Please note that while we do our best to be available for urgent issues outside of office hours, we are not available 24/7.  ? ?If you have an urgent issue and are unable to reach [place], you may choose to seek medical care at your doctor's office, retail clinic, urgent care center, or emergency room. ? ?If you have a medical emergency, please immediately call 911 or go to the emergency department. ? ?Pager Numbers ? ?- Dr. Nehemiah Massed:  [phone] ? ?- Dr. Laurence Ferrari: [phone] ? ?- Dr. Nicole Kindred: [phone] ? ?In the event of inclement weather, please call our main line at [phone] for an update on the status of any delays or closures. ? ?Dermatology Medication Tips: ?Please keep the boxes that topical medications come in in order to help keep track of the instructions about where and how to use these. Pharmacies typically print the medication instructions only on the boxes and not directly on the medication tubes.  ? ?If your medication is too expensive, please contact our office at [phone] option 4 or send [place] a message through [place].  ? ?We are unable to tell what your co-pay for medications will be in advance as this is different depending on your insurance coverage. However, we may be able to find a substitute medication at lower cost or fill out paperwork to get insurance to cover a needed medication.  ? ?If a prior authorization is required to get your medication covered by your insurance company, please allow [place] 1-2 business days to complete this process. ? ?Drug prices often vary depending on where the prescription is filled and some pharmacies may  offer cheaper prices. ? ?The website www.goodrx.com contains coupons for medications through different pharmacies. The prices here do not account for what the cost may be with help from insurance (it may be cheaper with your insurance), but the website can give you the price if you did not use any insurance.  ?- You can print the associated coupon and take it with your prescription to the pharmacy.  ?- You may also stop by our office during regular business hours and pick up a GoodRx coupon card.  ?- If you need your prescription sent electronically to a different pharmacy, notify our office through [facility] or by phone at [phone] option 4. ? ? ? ? ?Si Usted Necesita Algo Despu?s de Su Visita ? ?Tambi?n puede enviarnos un mensaje a trav?s de MyChart. Por lo general  respondemos a los mensajes de MyChart en el transcurso de 1 a 2 d?as h?biles. ? ?Para renovar recetas, por favor pida a su farmacia que se ponga en contacto con nuestra oficina. Nuestro n?mero de fax es el [phone]

## 2022-01-16 NOTE — Progress Notes (Signed)
? ?Follow-Up Visit ?  ?Subjective  ?Caitlin Gallagher is a 51 y.o. female who presents for the following: New Patient (Initial Visit) (Reports hx of HS for several years, hx of cyst surgery in left groin area, 1 active bump at right groin that has been inflamed for some time and would like checked. Patient also reports a sore lump in left axilla she has had for several years. ). Spot in groin will drain off and on.  Patient reports hx for many years currently on no treatment. ? ?The patient has spots, moles and lesions to be evaluated, some may be new or changing and the patient has concerns that these could be cancer. ? ? ?The following portions of the chart were reviewed this encounter and updated as appropriate:   ?  ? ?Review of Systems: No other skin or systemic complaints except as noted in HPI or Assessment and Plan. ? ? ?Objective  ?Well appearing patient in no apparent distress; mood and affect are within normal limits. ? ?A focused examination was performed including right groin, inguinal crease, left axilla. Relevant physical exam findings are noted in the Assessment and Plan. ? ?right inguinal crease ?1 cm fluctuant pink nodule tender to touch ? ?Left Axilla ?1.2 cm firm subcutaneous nodule  ? ? ?Assessment & Plan  ?Hidradenitis suppurativa ?right inguinal crease ? ?With inflamed cyst ?Chronic and persistent condition with duration or expected duration over one year. Condition is bothersome/symptomatic for patient. Currently flared.  ? ?Hidradenitis Suppurativa is a chronic; persistent; non-curable, but treatable condition due to abnormal inflamed sweat glands in the body folds (axilla, inframammary, groin, medial thighs), causing recurrent painful draining cysts and scarring. It can be associated with severe scarring acne and cysts; also abscesses and scarring of scalp. The goal is control and prevention of flares, as it is not curable. Scars are permanent and can be thickened. Treatment may include  daily use of topical medication and oral antibiotics.  Oral isotretinoin may also be helpful.  For more severe cases, Humira (a biologic injection) may be prescribed to decrease the inflammatory process and prevent flares.  When indicated, inflamed cysts may also be treated surgically. ? ?Discussed surgery, trx with antiobiotics and topicals, and incision and drainage.Recommend I&D, but pt defers today. ?Patient prefers to treat with antibiotics and topicals and once improved have it surgically removed. ? ?Start doxycycline 100 mg capsule - take 1 capsule po bid with food and drink for 2 wks when flared. Can take 1 cap qd once improved. ? ?Start clindamycin 1 % external solution - apply topically after shower to aa's at groin daily.  ? ?Start Panoxyl cream 4 % wash - apply to aa's of skin use like body wash let sit for a few minutes and rinse daily  ? ?Doxycycline should be taken with food to prevent nausea. Do not lay down for 30 minutes after taking. Be cautious with sun exposure and use good sun protection while on this medication. Pregnant women should not take this medication.  ? ? ?doxycycline (MONODOX) 100 MG capsule - right inguinal crease ?Take 1 capsule (100 mg total) by mouth 2 (two) times daily. With food and drink for 2 weeks when flared. Can take 1 cap qd once improved. ? ?clindamycin (CLEOCIN T) 1 % external solution - right inguinal crease ?Apply after shower to aa at groin daily for HS ? ?Epidermal inclusion cyst ?Left Axilla ? ?Benign-appearing. Exam most consistent with an epidermal inclusion cyst. Discussed that a cyst  is a benign growth that can grow over time and sometimes get irritated or inflamed. Recommend observation if it is not bothersome. Discussed option of surgical excision to remove it if it is growing, symptomatic, or other changes noted. Please call for new or changing lesions so they can be evaluated. ? ? ? ?Return in 2 months (on 03/18/2022) for  for cyst surgery at right inguinal  crease . ?I, Ruthell Rummage, CMA, am acting as scribe for Brendolyn Patty, MD. ? ?Documentation: I have reviewed the above documentation for accuracy and completeness, and I agree with the above. ? ?Brendolyn Patty MD  ? ?

## 2022-04-15 ENCOUNTER — Ambulatory Visit: Payer: 59 | Admitting: Dermatology

## 2023-07-04 LAB — COLOGUARD: COLOGUARD: NEGATIVE

## 2024-02-19 ENCOUNTER — Ambulatory Visit: Admitting: Podiatry

## 2024-02-19 DIAGNOSIS — M898X9 Other specified disorders of bone, unspecified site: Secondary | ICD-10-CM

## 2024-02-19 NOTE — Progress Notes (Signed)
 Subjective:  Patient ID: Caitlin Gallagher, female    DOB: 1971/07/17,  MRN: [id number]  Chief Complaint  Patient presents with   Nail Problem    53 y.o. female presents with the above complaint.  Patient presents with primary complaint of left great toe pain.  She states that she has a lot of pain on the bottom part of the distal toe.  She states it hurts worse in the pulp of the toe.  She notices mostly when she is walking and standing on the foot.  Feels like a tingling/shooting sensation consistent with nerve/neuritis.  She wanted to get it evaluated she had did have injury to that toe.  She has not immobilized it.  The nail itself is well adhered to the underlying nailbed.   Review of Systems: Negative except as noted in the HPI. Denies N/V/F/Ch.  Past Medical History:  Diagnosis Date   Allergy    Cancer (HCC)    cervical   Complication of anesthesia    hard to wake up    Current Outpatient Medications:    albuterol (VENTOLIN HFA) 108 (90 Base) MCG/ACT inhaler, Inhale into the lungs., Disp: , Rfl:    clindamycin  (CLEOCIN  T) 1 % external solution, Apply after shower to aa at groin daily for HS, Disp: 30 mL, Rfl: 2   clobetasol ointment (TEMOVATE) 0.05 %, Apply topically twice daily as needed to hands., Disp: , Rfl:    doxycycline  (MONODOX ) 100 MG capsule, TAKE 1 CAPSULE BY MOUTH TWICE DAILY( EVERY TWELVE HOURS) WITH FOOD AND DRINK FOR 2 WEEKS WHEN FLARED. MAY TAKE ONCE DAILY ONCE IMPROVED., Disp: 180 capsule, Rfl: 1   Lactobacillus Rhamnosus, GG, (RA PROBIOTIC DIGESTIVE CARE) CAPS, Take 1 capsule by mouth daily., Disp: , Rfl:    meloxicam (MOBIC) 15 MG tablet, meloxicam 15 mg tablet  TAKE 1 TABLET(S) EVERY DAY BY MOUTH WITH MEALS., Disp: , Rfl:    Multiple Vitamin (MULTI-VITAMIN) tablet, Take 1 tablet by mouth daily., Disp: , Rfl:    Multiple Vitamins-Calcium (DAILY VITAMINS FOR WOMEN) TABS, Take 2 tablets by mouth 2 (two) times daily.  , Disp: , Rfl:    Multiple Vitamins-Minerals  (MULTIVITAMIN WITH MINERALS) tablet, Take 1 tablet by mouth daily., Disp: , Rfl:    Probiotic Product (SOLUBLE FIBER/PROBIOTICS PO), Take by mouth daily.  , Disp: , Rfl:    traMADol  (ULTRAM ) 50 MG tablet, tramadol  50 mg tablet  TAKE 1 TABLET BY MOUTH EVERY 6 HOURS AS NEEDED FOR PAIN, Disp: , Rfl:   Social History   Tobacco Use  Smoking Status Former  Smokeless Tobacco Never    Allergies  Allergen Reactions   Butorphanol Tartrate Other (See Comments)    REACTION: Heart racing to the point that medical staff thought she would have a heart attack.   Versed [Midazolam] Other (See Comments)    Reaction: Heart racing to the point that medical staff thought she would have a heart attack.   Amoxicillin-Pot Clavulanate Diarrhea, Nausea And Vomiting and Other (See Comments)    REACTION: gi upset   Aspirin Other (See Comments)    REACTION: nose bleeds need medical help to stop   Codeine Itching   Erythromycin Diarrhea, Nausea And Vomiting and Other (See Comments)    REACTION: gi upset   Latex     REACTION: raw irritation   Thimerosal (Thiomersal) Other (See Comments)    REACTION: eye irritation   Objective:  There were no vitals filed for this visit. There is no  height or weight on file to calculate BMI. Constitutional Well developed. Well nourished.  Vascular Dorsalis pedis pulses palpable bilaterally. Posterior tibial pulses palpable bilaterally. Capillary refill normal to all digits.  No cyanosis or clubbing noted. Pedal hair growth normal.  Neurologic Normal speech. Oriented to person, place, and time. Epicritic sensation to light touch grossly present bilaterally.  Dermatologic Nails well groomed and normal in appearance. No open wounds. No skin lesions.  Orthopedic: Pain on palpation to the nail.  Nail is well adhered to the underlying nailbed.  Mild hematoma noted underneath the nail less than 20% involved of the nailbed.  Pain on palpation to the plantar aspect of the  great toe.  Very sensitive to touch.  Subjective neuritis symptoms noted.   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  No fracturing noted.  No other bony abnormalities identified no arthritis noted Assessment:  No diagnosis found. Plan:  Patient was evaluated and treated and all questions answered.  Left distal phalanx bony exostosis - Aqua the concerns were discussed with the patient extensive detail - Will continue to clinically monitor the exostosis and in the future patient may need debridement of the exostosis however for now toe protector was dispensed to take some of the pressure off as well as surgical shoe.  I encouraged her to wear surgical shoe for next 4 weeks and see if there is any improvement if there is no improvement we will discuss surgical intervention at that time - She also has hard time undergoing anesthesia as well.  No follow-ups on file.   Left great toe pain.  Distal phalanx spurring in the future may need to resect the spur.  Aggravated by right toe injury
